# Patient Record
Sex: Male | Born: 1950 | Race: White | Hispanic: No | Marital: Married | Smoking: Never smoker
Health system: Southern US, Community
[De-identification: ages and names within clinical notes are randomized; demographics above are authoritative.]

## PROBLEM LIST (undated history)

## (undated) DIAGNOSIS — G8929 Other chronic pain: Secondary | ICD-10-CM

## (undated) DIAGNOSIS — K219 Gastro-esophageal reflux disease without esophagitis: Secondary | ICD-10-CM

## (undated) DIAGNOSIS — E669 Obesity, unspecified: Secondary | ICD-10-CM

## (undated) DIAGNOSIS — R519 Headache, unspecified: Secondary | ICD-10-CM

## (undated) DIAGNOSIS — R001 Bradycardia, unspecified: Secondary | ICD-10-CM

## (undated) DIAGNOSIS — G629 Polyneuropathy, unspecified: Secondary | ICD-10-CM

## (undated) DIAGNOSIS — E559 Vitamin D deficiency, unspecified: Secondary | ICD-10-CM

## (undated) DIAGNOSIS — G3184 Mild cognitive impairment, so stated: Secondary | ICD-10-CM

## (undated) DIAGNOSIS — E7800 Pure hypercholesterolemia, unspecified: Secondary | ICD-10-CM

## (undated) DIAGNOSIS — K589 Irritable bowel syndrome without diarrhea: Secondary | ICD-10-CM

## (undated) HISTORY — PX: SPINE SURGERY: SHX786

## (undated) HISTORY — DX: Other chronic pain: G89.29

## (undated) HISTORY — DX: Gastro-esophageal reflux disease without esophagitis: K21.9

## (undated) HISTORY — DX: Obesity, unspecified: E66.9

## (undated) HISTORY — DX: Polyneuropathy, unspecified: G62.9

## (undated) HISTORY — DX: Headache, unspecified: R51.9

## (undated) HISTORY — DX: Irritable bowel syndrome, unspecified: K58.9

## (undated) HISTORY — DX: Bradycardia, unspecified: R00.1

## (undated) HISTORY — DX: Pure hypercholesterolemia, unspecified: E78.00

## (undated) HISTORY — DX: Mild cognitive impairment of uncertain or unknown etiology: G31.84

## (undated) HISTORY — DX: Vitamin D deficiency, unspecified: E55.9

---

## 2015-01-29 DIAGNOSIS — L570 Actinic keratosis: Secondary | ICD-10-CM | POA: Diagnosis not present

## 2015-01-29 DIAGNOSIS — L821 Other seborrheic keratosis: Secondary | ICD-10-CM | POA: Diagnosis not present

## 2015-04-19 DIAGNOSIS — H16143 Punctate keratitis, bilateral: Secondary | ICD-10-CM | POA: Diagnosis not present

## 2015-06-11 DIAGNOSIS — D510 Vitamin B12 deficiency anemia due to intrinsic factor deficiency: Secondary | ICD-10-CM | POA: Diagnosis not present

## 2015-06-11 DIAGNOSIS — N419 Inflammatory disease of prostate, unspecified: Secondary | ICD-10-CM | POA: Diagnosis not present

## 2015-06-11 DIAGNOSIS — Z13220 Encounter for screening for lipoid disorders: Secondary | ICD-10-CM | POA: Diagnosis not present

## 2015-06-11 DIAGNOSIS — Z125 Encounter for screening for malignant neoplasm of prostate: Secondary | ICD-10-CM | POA: Diagnosis not present

## 2015-06-11 DIAGNOSIS — Z79899 Other long term (current) drug therapy: Secondary | ICD-10-CM | POA: Diagnosis not present

## 2015-06-11 DIAGNOSIS — R5383 Other fatigue: Secondary | ICD-10-CM | POA: Diagnosis not present

## 2015-06-11 DIAGNOSIS — Z2821 Immunization not carried out because of patient refusal: Secondary | ICD-10-CM | POA: Diagnosis not present

## 2015-06-11 DIAGNOSIS — Z0000 Encounter for general adult medical examination without abnormal findings: Secondary | ICD-10-CM | POA: Diagnosis not present

## 2015-06-11 DIAGNOSIS — E785 Hyperlipidemia, unspecified: Secondary | ICD-10-CM | POA: Diagnosis not present

## 2015-09-03 DIAGNOSIS — Z9181 History of falling: Secondary | ICD-10-CM | POA: Diagnosis not present

## 2015-09-03 DIAGNOSIS — Z0000 Encounter for general adult medical examination without abnormal findings: Secondary | ICD-10-CM | POA: Diagnosis not present

## 2015-09-03 DIAGNOSIS — Z23 Encounter for immunization: Secondary | ICD-10-CM | POA: Diagnosis not present

## 2015-09-03 DIAGNOSIS — Z1389 Encounter for screening for other disorder: Secondary | ICD-10-CM | POA: Diagnosis not present

## 2016-02-06 DIAGNOSIS — J209 Acute bronchitis, unspecified: Secondary | ICD-10-CM | POA: Diagnosis not present

## 2016-02-22 DIAGNOSIS — R05 Cough: Secondary | ICD-10-CM | POA: Diagnosis not present

## 2016-02-27 DIAGNOSIS — J189 Pneumonia, unspecified organism: Secondary | ICD-10-CM | POA: Diagnosis not present

## 2016-03-06 DIAGNOSIS — H6123 Impacted cerumen, bilateral: Secondary | ICD-10-CM | POA: Diagnosis not present

## 2016-03-07 DIAGNOSIS — H66012 Acute suppurative otitis media with spontaneous rupture of ear drum, left ear: Secondary | ICD-10-CM | POA: Diagnosis not present

## 2016-03-07 DIAGNOSIS — R42 Dizziness and giddiness: Secondary | ICD-10-CM | POA: Diagnosis not present

## 2016-03-07 DIAGNOSIS — R109 Unspecified abdominal pain: Secondary | ICD-10-CM | POA: Diagnosis not present

## 2016-03-07 DIAGNOSIS — H60322 Hemorrhagic otitis externa, left ear: Secondary | ICD-10-CM | POA: Diagnosis not present

## 2016-03-07 DIAGNOSIS — H7292 Unspecified perforation of tympanic membrane, left ear: Secondary | ICD-10-CM | POA: Diagnosis not present

## 2016-03-07 DIAGNOSIS — H8302 Labyrinthitis, left ear: Secondary | ICD-10-CM | POA: Diagnosis not present

## 2016-03-07 DIAGNOSIS — H6502 Acute serous otitis media, left ear: Secondary | ICD-10-CM | POA: Diagnosis not present

## 2016-03-07 DIAGNOSIS — R112 Nausea with vomiting, unspecified: Secondary | ICD-10-CM | POA: Diagnosis not present

## 2016-03-08 DIAGNOSIS — H8302 Labyrinthitis, left ear: Secondary | ICD-10-CM | POA: Diagnosis not present

## 2016-03-08 DIAGNOSIS — R112 Nausea with vomiting, unspecified: Secondary | ICD-10-CM | POA: Diagnosis not present

## 2016-03-08 DIAGNOSIS — H60322 Hemorrhagic otitis externa, left ear: Secondary | ICD-10-CM | POA: Diagnosis not present

## 2016-03-08 DIAGNOSIS — H6502 Acute serous otitis media, left ear: Secondary | ICD-10-CM | POA: Diagnosis not present

## 2016-03-08 DIAGNOSIS — H66012 Acute suppurative otitis media with spontaneous rupture of ear drum, left ear: Secondary | ICD-10-CM | POA: Diagnosis not present

## 2016-03-08 DIAGNOSIS — R42 Dizziness and giddiness: Secondary | ICD-10-CM | POA: Diagnosis not present

## 2016-03-08 DIAGNOSIS — R109 Unspecified abdominal pain: Secondary | ICD-10-CM | POA: Diagnosis not present

## 2016-03-08 DIAGNOSIS — H7292 Unspecified perforation of tympanic membrane, left ear: Secondary | ICD-10-CM | POA: Diagnosis not present

## 2016-03-12 DIAGNOSIS — H60312 Diffuse otitis externa, left ear: Secondary | ICD-10-CM | POA: Diagnosis not present

## 2016-03-12 DIAGNOSIS — H9202 Otalgia, left ear: Secondary | ICD-10-CM | POA: Diagnosis not present

## 2016-03-12 DIAGNOSIS — H66012 Acute suppurative otitis media with spontaneous rupture of ear drum, left ear: Secondary | ICD-10-CM | POA: Diagnosis not present

## 2016-03-12 DIAGNOSIS — J342 Deviated nasal septum: Secondary | ICD-10-CM | POA: Diagnosis not present

## 2016-03-12 DIAGNOSIS — H7292 Unspecified perforation of tympanic membrane, left ear: Secondary | ICD-10-CM | POA: Diagnosis not present

## 2016-03-12 DIAGNOSIS — H9212 Otorrhea, left ear: Secondary | ICD-10-CM | POA: Diagnosis not present

## 2016-03-20 DIAGNOSIS — H73002 Acute myringitis, left ear: Secondary | ICD-10-CM | POA: Diagnosis not present

## 2016-03-20 DIAGNOSIS — H60312 Diffuse otitis externa, left ear: Secondary | ICD-10-CM | POA: Diagnosis not present

## 2016-03-20 DIAGNOSIS — H9202 Otalgia, left ear: Secondary | ICD-10-CM | POA: Diagnosis not present

## 2016-03-26 DIAGNOSIS — J189 Pneumonia, unspecified organism: Secondary | ICD-10-CM | POA: Diagnosis not present

## 2016-03-26 DIAGNOSIS — Z23 Encounter for immunization: Secondary | ICD-10-CM | POA: Diagnosis not present

## 2016-03-26 DIAGNOSIS — H6092 Unspecified otitis externa, left ear: Secondary | ICD-10-CM | POA: Diagnosis not present

## 2016-04-10 DIAGNOSIS — J40 Bronchitis, not specified as acute or chronic: Secondary | ICD-10-CM | POA: Diagnosis not present

## 2016-04-20 DIAGNOSIS — J209 Acute bronchitis, unspecified: Secondary | ICD-10-CM | POA: Diagnosis not present

## 2016-04-30 DIAGNOSIS — K591 Functional diarrhea: Secondary | ICD-10-CM | POA: Diagnosis not present

## 2016-04-30 DIAGNOSIS — R109 Unspecified abdominal pain: Secondary | ICD-10-CM | POA: Diagnosis not present

## 2016-04-30 DIAGNOSIS — K219 Gastro-esophageal reflux disease without esophagitis: Secondary | ICD-10-CM | POA: Diagnosis not present

## 2016-07-14 DIAGNOSIS — N419 Inflammatory disease of prostate, unspecified: Secondary | ICD-10-CM | POA: Diagnosis not present

## 2016-11-19 DIAGNOSIS — H6123 Impacted cerumen, bilateral: Secondary | ICD-10-CM | POA: Diagnosis not present

## 2017-02-02 DIAGNOSIS — Z23 Encounter for immunization: Secondary | ICD-10-CM | POA: Diagnosis not present

## 2017-02-23 DIAGNOSIS — Z6825 Body mass index (BMI) 25.0-25.9, adult: Secondary | ICD-10-CM | POA: Diagnosis not present

## 2017-02-23 DIAGNOSIS — D519 Vitamin B12 deficiency anemia, unspecified: Secondary | ICD-10-CM | POA: Diagnosis not present

## 2017-02-23 DIAGNOSIS — E785 Hyperlipidemia, unspecified: Secondary | ICD-10-CM | POA: Diagnosis not present

## 2017-02-23 DIAGNOSIS — Z125 Encounter for screening for malignant neoplasm of prostate: Secondary | ICD-10-CM | POA: Diagnosis not present

## 2017-02-23 DIAGNOSIS — Z9181 History of falling: Secondary | ICD-10-CM | POA: Diagnosis not present

## 2017-02-23 DIAGNOSIS — Z79899 Other long term (current) drug therapy: Secondary | ICD-10-CM | POA: Diagnosis not present

## 2017-02-23 DIAGNOSIS — Z1331 Encounter for screening for depression: Secondary | ICD-10-CM | POA: Diagnosis not present

## 2017-02-23 DIAGNOSIS — Z0000 Encounter for general adult medical examination without abnormal findings: Secondary | ICD-10-CM | POA: Diagnosis not present

## 2017-03-07 DIAGNOSIS — J209 Acute bronchitis, unspecified: Secondary | ICD-10-CM | POA: Diagnosis not present

## 2017-03-24 DIAGNOSIS — Z6825 Body mass index (BMI) 25.0-25.9, adult: Secondary | ICD-10-CM | POA: Diagnosis not present

## 2017-03-24 DIAGNOSIS — J40 Bronchitis, not specified as acute or chronic: Secondary | ICD-10-CM | POA: Diagnosis not present

## 2017-03-24 DIAGNOSIS — J329 Chronic sinusitis, unspecified: Secondary | ICD-10-CM | POA: Diagnosis not present

## 2017-06-04 DIAGNOSIS — K521 Toxic gastroenteritis and colitis: Secondary | ICD-10-CM | POA: Diagnosis not present

## 2017-06-21 DIAGNOSIS — A932 Colorado tick fever: Secondary | ICD-10-CM | POA: Diagnosis not present

## 2017-06-21 DIAGNOSIS — L299 Pruritus, unspecified: Secondary | ICD-10-CM | POA: Diagnosis not present

## 2017-09-20 DIAGNOSIS — M545 Low back pain: Secondary | ICD-10-CM | POA: Diagnosis not present

## 2017-09-20 DIAGNOSIS — M9902 Segmental and somatic dysfunction of thoracic region: Secondary | ICD-10-CM | POA: Diagnosis not present

## 2017-09-20 DIAGNOSIS — M9905 Segmental and somatic dysfunction of pelvic region: Secondary | ICD-10-CM | POA: Diagnosis not present

## 2017-09-20 DIAGNOSIS — M9903 Segmental and somatic dysfunction of lumbar region: Secondary | ICD-10-CM | POA: Diagnosis not present

## 2017-09-22 DIAGNOSIS — M9902 Segmental and somatic dysfunction of thoracic region: Secondary | ICD-10-CM | POA: Diagnosis not present

## 2017-09-22 DIAGNOSIS — M9903 Segmental and somatic dysfunction of lumbar region: Secondary | ICD-10-CM | POA: Diagnosis not present

## 2017-09-22 DIAGNOSIS — M545 Low back pain: Secondary | ICD-10-CM | POA: Diagnosis not present

## 2017-09-22 DIAGNOSIS — M9905 Segmental and somatic dysfunction of pelvic region: Secondary | ICD-10-CM | POA: Diagnosis not present

## 2017-11-30 DIAGNOSIS — M5431 Sciatica, right side: Secondary | ICD-10-CM | POA: Diagnosis not present

## 2017-11-30 DIAGNOSIS — Z23 Encounter for immunization: Secondary | ICD-10-CM | POA: Diagnosis not present

## 2017-11-30 DIAGNOSIS — Z1339 Encounter for screening examination for other mental health and behavioral disorders: Secondary | ICD-10-CM | POA: Diagnosis not present

## 2017-11-30 DIAGNOSIS — Z6825 Body mass index (BMI) 25.0-25.9, adult: Secondary | ICD-10-CM | POA: Diagnosis not present

## 2018-01-20 DIAGNOSIS — M545 Low back pain: Secondary | ICD-10-CM | POA: Diagnosis not present

## 2018-01-20 DIAGNOSIS — M4306 Spondylolysis, lumbar region: Secondary | ICD-10-CM | POA: Diagnosis not present

## 2018-02-07 DIAGNOSIS — M4187 Other forms of scoliosis, lumbosacral region: Secondary | ICD-10-CM | POA: Diagnosis not present

## 2018-02-07 DIAGNOSIS — M545 Low back pain: Secondary | ICD-10-CM | POA: Diagnosis not present

## 2018-02-21 DIAGNOSIS — M545 Low back pain: Secondary | ICD-10-CM | POA: Diagnosis not present

## 2018-02-21 DIAGNOSIS — M4306 Spondylolysis, lumbar region: Secondary | ICD-10-CM | POA: Diagnosis not present

## 2018-02-21 DIAGNOSIS — M48062 Spinal stenosis, lumbar region with neurogenic claudication: Secondary | ICD-10-CM | POA: Diagnosis not present

## 2018-02-24 DIAGNOSIS — M5442 Lumbago with sciatica, left side: Secondary | ICD-10-CM | POA: Diagnosis not present

## 2018-02-24 DIAGNOSIS — M545 Low back pain: Secondary | ICD-10-CM | POA: Diagnosis not present

## 2018-02-24 DIAGNOSIS — M48062 Spinal stenosis, lumbar region with neurogenic claudication: Secondary | ICD-10-CM | POA: Diagnosis not present

## 2018-03-01 DIAGNOSIS — M48062 Spinal stenosis, lumbar region with neurogenic claudication: Secondary | ICD-10-CM | POA: Diagnosis not present

## 2018-03-10 DIAGNOSIS — R69 Illness, unspecified: Secondary | ICD-10-CM | POA: Diagnosis not present

## 2018-03-21 DIAGNOSIS — Z6826 Body mass index (BMI) 26.0-26.9, adult: Secondary | ICD-10-CM | POA: Diagnosis not present

## 2018-03-21 DIAGNOSIS — M545 Low back pain: Secondary | ICD-10-CM | POA: Diagnosis not present

## 2018-03-21 DIAGNOSIS — M5442 Lumbago with sciatica, left side: Secondary | ICD-10-CM | POA: Diagnosis not present

## 2018-03-21 DIAGNOSIS — M48062 Spinal stenosis, lumbar region with neurogenic claudication: Secondary | ICD-10-CM | POA: Diagnosis not present

## 2018-03-21 DIAGNOSIS — M4306 Spondylolysis, lumbar region: Secondary | ICD-10-CM | POA: Diagnosis not present

## 2018-04-21 DIAGNOSIS — M5416 Radiculopathy, lumbar region: Secondary | ICD-10-CM | POA: Diagnosis not present

## 2018-04-21 DIAGNOSIS — M4306 Spondylolysis, lumbar region: Secondary | ICD-10-CM | POA: Diagnosis not present

## 2018-04-21 DIAGNOSIS — J0100 Acute maxillary sinusitis, unspecified: Secondary | ICD-10-CM | POA: Diagnosis not present

## 2018-04-21 DIAGNOSIS — M5136 Other intervertebral disc degeneration, lumbar region: Secondary | ICD-10-CM | POA: Diagnosis not present

## 2018-04-21 DIAGNOSIS — Z6826 Body mass index (BMI) 26.0-26.9, adult: Secondary | ICD-10-CM | POA: Diagnosis not present

## 2018-04-26 DIAGNOSIS — Z13220 Encounter for screening for lipoid disorders: Secondary | ICD-10-CM | POA: Diagnosis not present

## 2018-04-26 DIAGNOSIS — Z0000 Encounter for general adult medical examination without abnormal findings: Secondary | ICD-10-CM | POA: Diagnosis not present

## 2018-04-26 DIAGNOSIS — Z6826 Body mass index (BMI) 26.0-26.9, adult: Secondary | ICD-10-CM | POA: Diagnosis not present

## 2018-04-26 DIAGNOSIS — Z79899 Other long term (current) drug therapy: Secondary | ICD-10-CM | POA: Diagnosis not present

## 2018-04-26 DIAGNOSIS — R5383 Other fatigue: Secondary | ICD-10-CM | POA: Diagnosis not present

## 2018-04-26 DIAGNOSIS — J329 Chronic sinusitis, unspecified: Secondary | ICD-10-CM | POA: Diagnosis not present

## 2018-04-26 DIAGNOSIS — J40 Bronchitis, not specified as acute or chronic: Secondary | ICD-10-CM | POA: Diagnosis not present

## 2018-05-15 DIAGNOSIS — M792 Neuralgia and neuritis, unspecified: Secondary | ICD-10-CM | POA: Diagnosis not present

## 2018-05-23 DIAGNOSIS — M4308 Spondylolysis, sacral and sacrococcygeal region: Secondary | ICD-10-CM | POA: Diagnosis not present

## 2018-05-23 DIAGNOSIS — M545 Low back pain: Secondary | ICD-10-CM | POA: Diagnosis not present

## 2018-05-23 DIAGNOSIS — Z4689 Encounter for fitting and adjustment of other specified devices: Secondary | ICD-10-CM | POA: Diagnosis not present

## 2018-05-23 DIAGNOSIS — M5416 Radiculopathy, lumbar region: Secondary | ICD-10-CM | POA: Diagnosis not present

## 2018-05-23 DIAGNOSIS — M5136 Other intervertebral disc degeneration, lumbar region: Secondary | ICD-10-CM | POA: Diagnosis not present

## 2018-06-03 DIAGNOSIS — R001 Bradycardia, unspecified: Secondary | ICD-10-CM | POA: Diagnosis not present

## 2018-06-03 DIAGNOSIS — R9431 Abnormal electrocardiogram [ECG] [EKG]: Secondary | ICD-10-CM | POA: Diagnosis not present

## 2018-06-03 DIAGNOSIS — Z01818 Encounter for other preprocedural examination: Secondary | ICD-10-CM | POA: Diagnosis not present

## 2018-06-13 DIAGNOSIS — M5117 Intervertebral disc disorders with radiculopathy, lumbosacral region: Secondary | ICD-10-CM | POA: Diagnosis not present

## 2018-06-13 DIAGNOSIS — M5416 Radiculopathy, lumbar region: Secondary | ICD-10-CM | POA: Diagnosis not present

## 2018-06-13 DIAGNOSIS — M4807 Spinal stenosis, lumbosacral region: Secondary | ICD-10-CM | POA: Diagnosis not present

## 2018-06-13 DIAGNOSIS — R110 Nausea: Secondary | ICD-10-CM | POA: Diagnosis not present

## 2018-06-13 DIAGNOSIS — M5106 Intervertebral disc disorders with myelopathy, lumbar region: Secondary | ICD-10-CM | POA: Diagnosis not present

## 2018-06-13 DIAGNOSIS — M47896 Other spondylosis, lumbar region: Secondary | ICD-10-CM | POA: Diagnosis not present

## 2018-06-13 DIAGNOSIS — M5137 Other intervertebral disc degeneration, lumbosacral region: Secondary | ICD-10-CM | POA: Diagnosis not present

## 2018-06-13 DIAGNOSIS — M48061 Spinal stenosis, lumbar region without neurogenic claudication: Secondary | ICD-10-CM | POA: Diagnosis not present

## 2018-06-13 DIAGNOSIS — M48062 Spinal stenosis, lumbar region with neurogenic claudication: Secondary | ICD-10-CM | POA: Diagnosis not present

## 2018-06-13 DIAGNOSIS — M51369 Other intervertebral disc degeneration, lumbar region without mention of lumbar back pain or lower extremity pain: Secondary | ICD-10-CM

## 2018-06-13 DIAGNOSIS — M545 Low back pain: Secondary | ICD-10-CM | POA: Diagnosis not present

## 2018-06-13 DIAGNOSIS — M4326 Fusion of spine, lumbar region: Secondary | ICD-10-CM | POA: Diagnosis not present

## 2018-06-13 DIAGNOSIS — R200 Anesthesia of skin: Secondary | ICD-10-CM | POA: Diagnosis not present

## 2018-06-13 DIAGNOSIS — M4716 Other spondylosis with myelopathy, lumbar region: Secondary | ICD-10-CM | POA: Diagnosis not present

## 2018-06-13 DIAGNOSIS — M5116 Intervertebral disc disorders with radiculopathy, lumbar region: Secondary | ICD-10-CM | POA: Diagnosis not present

## 2018-06-13 DIAGNOSIS — M4726 Other spondylosis with radiculopathy, lumbar region: Secondary | ICD-10-CM | POA: Diagnosis not present

## 2018-06-13 DIAGNOSIS — M5136 Other intervertebral disc degeneration, lumbar region: Secondary | ICD-10-CM | POA: Insufficient documentation

## 2018-06-13 HISTORY — DX: Other intervertebral disc degeneration, lumbar region without mention of lumbar back pain or lower extremity pain: M51.369

## 2018-06-28 ENCOUNTER — Ambulatory Visit
Admission: RE | Admit: 2018-06-28 | Discharge: 2018-06-28 | Disposition: A | Discharge status: Home / Self Care | Payer: Medicare HMO | Source: Ambulatory Visit | Attending: Rehabilitation | Admitting: Rehabilitation

## 2018-06-28 ENCOUNTER — Other Ambulatory Visit: Payer: Self-pay

## 2018-06-28 ENCOUNTER — Other Ambulatory Visit: Payer: Self-pay | Admitting: Rehabilitation

## 2018-06-28 DIAGNOSIS — I824Y2 Acute embolism and thrombosis of unspecified deep veins of left proximal lower extremity: Secondary | ICD-10-CM

## 2018-06-28 DIAGNOSIS — M549 Dorsalgia, unspecified: Secondary | ICD-10-CM | POA: Diagnosis not present

## 2018-06-28 DIAGNOSIS — R600 Localized edema: Secondary | ICD-10-CM | POA: Diagnosis not present

## 2018-07-11 DIAGNOSIS — M545 Low back pain: Secondary | ICD-10-CM | POA: Diagnosis not present

## 2018-07-15 DIAGNOSIS — M25572 Pain in left ankle and joints of left foot: Secondary | ICD-10-CM | POA: Diagnosis not present

## 2018-07-15 DIAGNOSIS — Z6826 Body mass index (BMI) 26.0-26.9, adult: Secondary | ICD-10-CM | POA: Diagnosis not present

## 2018-08-16 DIAGNOSIS — Z0000 Encounter for general adult medical examination without abnormal findings: Secondary | ICD-10-CM | POA: Diagnosis not present

## 2018-08-16 DIAGNOSIS — Z6826 Body mass index (BMI) 26.0-26.9, adult: Secondary | ICD-10-CM | POA: Diagnosis not present

## 2018-08-16 DIAGNOSIS — Z1331 Encounter for screening for depression: Secondary | ICD-10-CM | POA: Diagnosis not present

## 2018-08-16 DIAGNOSIS — Z9181 History of falling: Secondary | ICD-10-CM | POA: Diagnosis not present

## 2018-09-08 DIAGNOSIS — Z79899 Other long term (current) drug therapy: Secondary | ICD-10-CM | POA: Diagnosis not present

## 2018-09-08 DIAGNOSIS — G894 Chronic pain syndrome: Secondary | ICD-10-CM | POA: Diagnosis not present

## 2018-09-08 DIAGNOSIS — M4716 Other spondylosis with myelopathy, lumbar region: Secondary | ICD-10-CM | POA: Diagnosis not present

## 2018-09-08 DIAGNOSIS — Z79891 Long term (current) use of opiate analgesic: Secondary | ICD-10-CM | POA: Diagnosis not present

## 2018-12-08 DIAGNOSIS — M4327 Fusion of spine, lumbosacral region: Secondary | ICD-10-CM | POA: Diagnosis not present

## 2018-12-08 DIAGNOSIS — M545 Low back pain: Secondary | ICD-10-CM | POA: Diagnosis not present

## 2018-12-30 DIAGNOSIS — Z23 Encounter for immunization: Secondary | ICD-10-CM | POA: Diagnosis not present

## 2019-03-31 DIAGNOSIS — Z0100 Encounter for examination of eyes and vision without abnormal findings: Secondary | ICD-10-CM | POA: Diagnosis not present

## 2019-03-31 DIAGNOSIS — Z961 Presence of intraocular lens: Secondary | ICD-10-CM | POA: Diagnosis not present

## 2019-06-08 DIAGNOSIS — M4327 Fusion of spine, lumbosacral region: Secondary | ICD-10-CM | POA: Diagnosis not present

## 2019-06-08 DIAGNOSIS — M545 Low back pain: Secondary | ICD-10-CM | POA: Diagnosis not present

## 2019-06-08 DIAGNOSIS — M5416 Radiculopathy, lumbar region: Secondary | ICD-10-CM | POA: Diagnosis not present

## 2019-06-13 DIAGNOSIS — M5430 Sciatica, unspecified side: Secondary | ICD-10-CM | POA: Diagnosis not present

## 2019-06-13 DIAGNOSIS — G8929 Other chronic pain: Secondary | ICD-10-CM | POA: Diagnosis not present

## 2019-06-13 DIAGNOSIS — M549 Dorsalgia, unspecified: Secondary | ICD-10-CM | POA: Diagnosis not present

## 2019-06-13 DIAGNOSIS — M79602 Pain in left arm: Secondary | ICD-10-CM | POA: Diagnosis not present

## 2019-06-13 DIAGNOSIS — Z6826 Body mass index (BMI) 26.0-26.9, adult: Secondary | ICD-10-CM | POA: Diagnosis not present

## 2019-06-14 DIAGNOSIS — M5416 Radiculopathy, lumbar region: Secondary | ICD-10-CM | POA: Diagnosis not present

## 2019-06-14 DIAGNOSIS — M545 Low back pain: Secondary | ICD-10-CM | POA: Diagnosis not present

## 2019-06-14 DIAGNOSIS — Z6826 Body mass index (BMI) 26.0-26.9, adult: Secondary | ICD-10-CM | POA: Diagnosis not present

## 2019-06-14 DIAGNOSIS — M4327 Fusion of spine, lumbosacral region: Secondary | ICD-10-CM | POA: Diagnosis not present

## 2019-06-27 DIAGNOSIS — M48061 Spinal stenosis, lumbar region without neurogenic claudication: Secondary | ICD-10-CM | POA: Diagnosis not present

## 2019-06-27 DIAGNOSIS — Z981 Arthrodesis status: Secondary | ICD-10-CM | POA: Diagnosis not present

## 2019-07-17 DIAGNOSIS — M5416 Radiculopathy, lumbar region: Secondary | ICD-10-CM | POA: Diagnosis not present

## 2019-07-17 DIAGNOSIS — M4327 Fusion of spine, lumbosacral region: Secondary | ICD-10-CM | POA: Diagnosis not present

## 2019-07-17 DIAGNOSIS — Z6826 Body mass index (BMI) 26.0-26.9, adult: Secondary | ICD-10-CM | POA: Diagnosis not present

## 2019-07-17 DIAGNOSIS — M545 Low back pain: Secondary | ICD-10-CM | POA: Diagnosis not present

## 2019-07-25 DIAGNOSIS — M47816 Spondylosis without myelopathy or radiculopathy, lumbar region: Secondary | ICD-10-CM | POA: Diagnosis not present

## 2019-09-19 DIAGNOSIS — J218 Acute bronchiolitis due to other specified organisms: Secondary | ICD-10-CM | POA: Diagnosis not present

## 2019-09-19 DIAGNOSIS — Z20828 Contact with and (suspected) exposure to other viral communicable diseases: Secondary | ICD-10-CM | POA: Diagnosis not present

## 2019-10-10 DIAGNOSIS — M7542 Impingement syndrome of left shoulder: Secondary | ICD-10-CM | POA: Diagnosis not present

## 2019-11-11 DIAGNOSIS — Z20828 Contact with and (suspected) exposure to other viral communicable diseases: Secondary | ICD-10-CM | POA: Diagnosis not present

## 2019-11-11 DIAGNOSIS — J3489 Other specified disorders of nose and nasal sinuses: Secondary | ICD-10-CM | POA: Diagnosis not present

## 2019-11-21 DIAGNOSIS — M7540 Impingement syndrome of unspecified shoulder: Secondary | ICD-10-CM | POA: Diagnosis not present

## 2019-12-31 DIAGNOSIS — R809 Proteinuria, unspecified: Secondary | ICD-10-CM | POA: Diagnosis not present

## 2019-12-31 DIAGNOSIS — N401 Enlarged prostate with lower urinary tract symptoms: Secondary | ICD-10-CM | POA: Diagnosis not present

## 2020-01-24 DIAGNOSIS — L820 Inflamed seborrheic keratosis: Secondary | ICD-10-CM | POA: Diagnosis not present

## 2020-01-29 DIAGNOSIS — M47816 Spondylosis without myelopathy or radiculopathy, lumbar region: Secondary | ICD-10-CM | POA: Diagnosis not present

## 2020-01-31 DIAGNOSIS — Z23 Encounter for immunization: Secondary | ICD-10-CM | POA: Diagnosis not present

## 2020-02-01 DIAGNOSIS — K5730 Diverticulosis of large intestine without perforation or abscess without bleeding: Secondary | ICD-10-CM | POA: Diagnosis not present

## 2020-02-01 DIAGNOSIS — K429 Umbilical hernia without obstruction or gangrene: Secondary | ICD-10-CM | POA: Diagnosis not present

## 2020-02-01 DIAGNOSIS — R102 Pelvic and perineal pain: Secondary | ICD-10-CM | POA: Diagnosis not present

## 2020-02-01 DIAGNOSIS — M5136 Other intervertebral disc degeneration, lumbar region: Secondary | ICD-10-CM | POA: Diagnosis not present

## 2020-02-01 DIAGNOSIS — R1032 Left lower quadrant pain: Secondary | ICD-10-CM | POA: Diagnosis not present

## 2020-02-01 IMAGING — US VENOUS DOPPLER ULTRASOUND OF LEFT LOWER EXTREMITY
1 series · 13 of 24 positions shown · non-contrast
Comparison: None.

CLINICAL DATA: Pain, edema left lower extremity



[Series 1: venous doppler ultrasound of left lower extremity · 0.08mm/px · 13 of 31 slices shown]
[im 1/31]
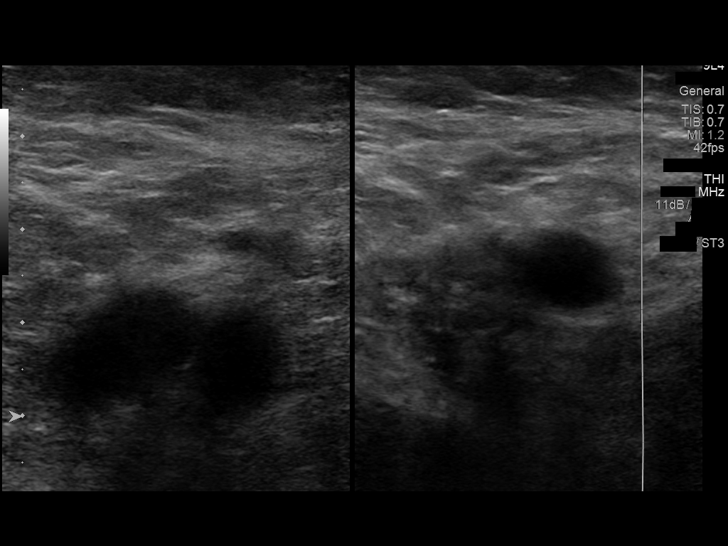
[im 3/31]
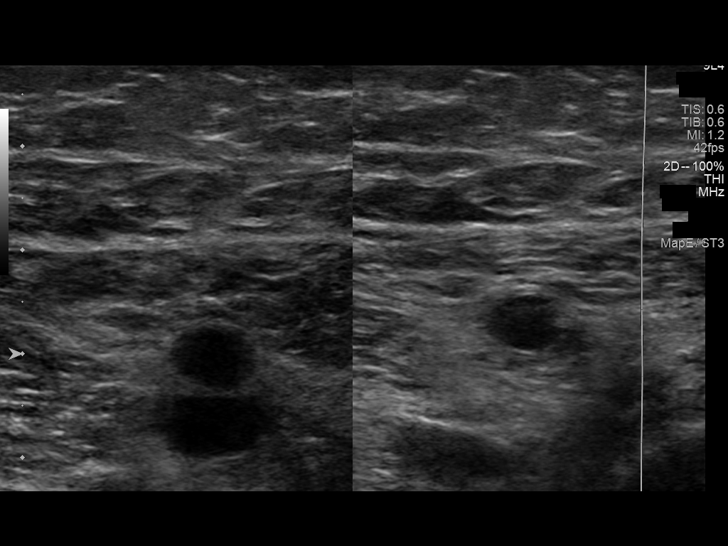
[im 6/31]
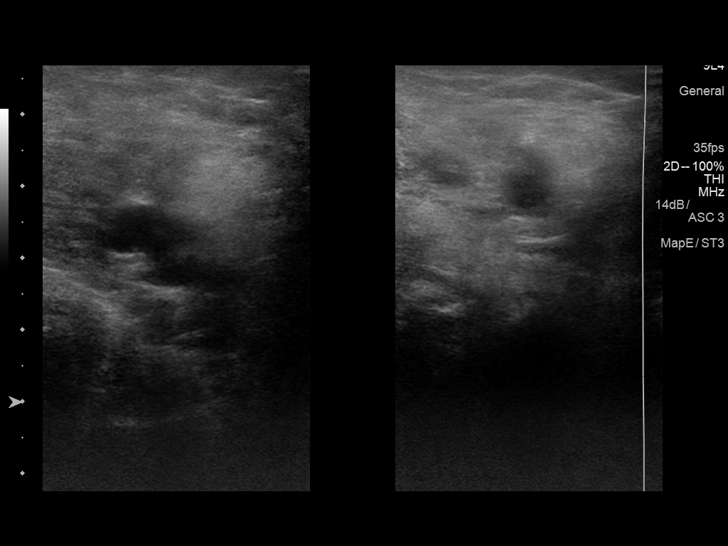
[im 8/31]
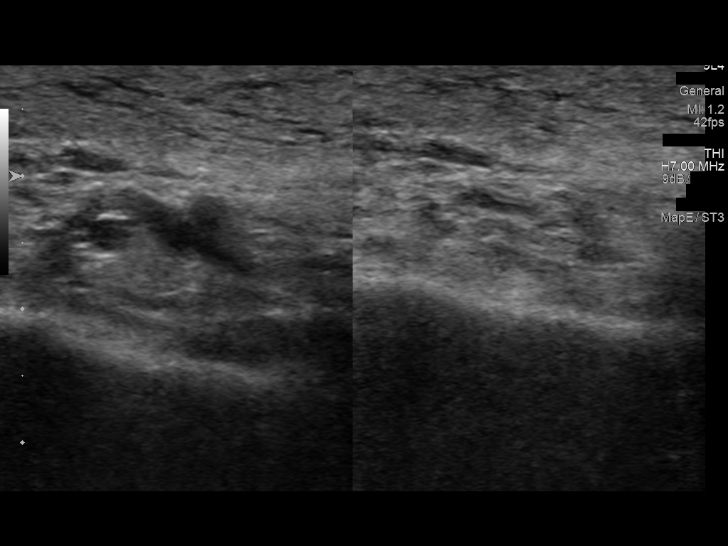
[im 11/31]
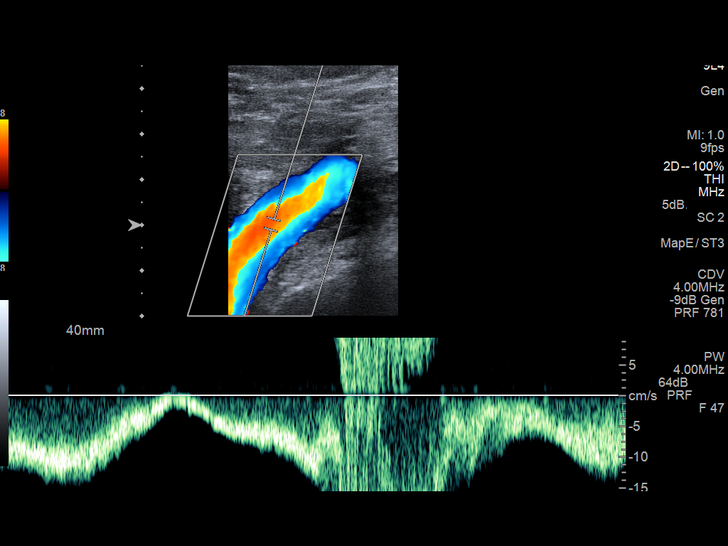
[im 14/31]
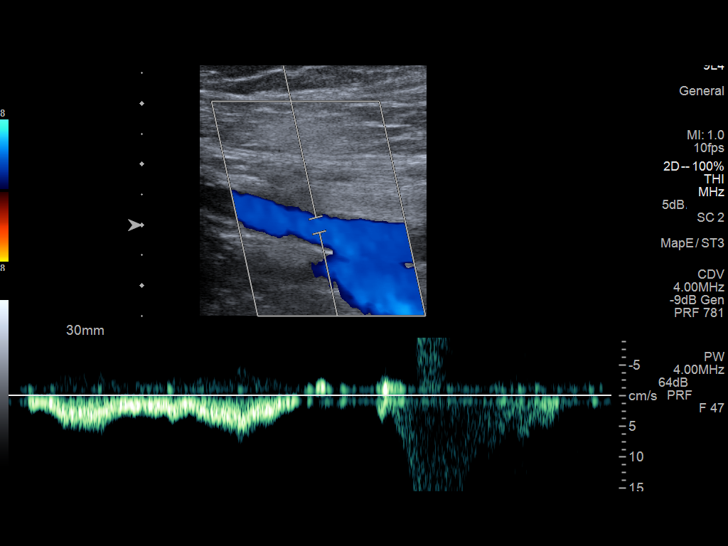
[im 16/31]
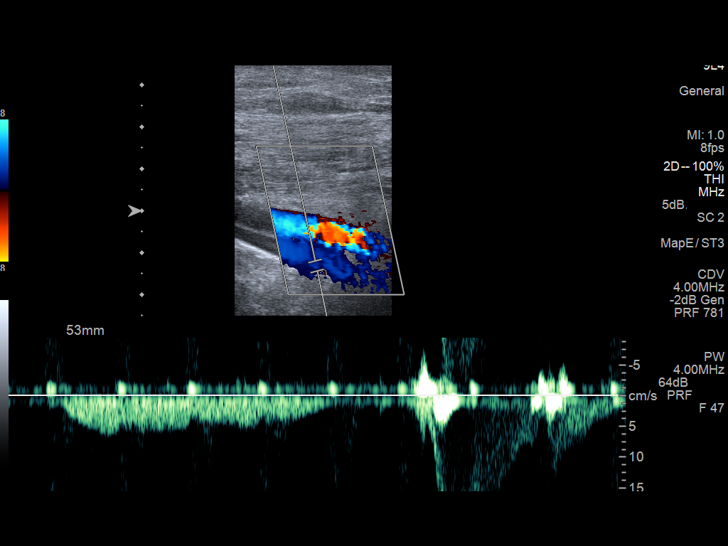
[im 17/31]
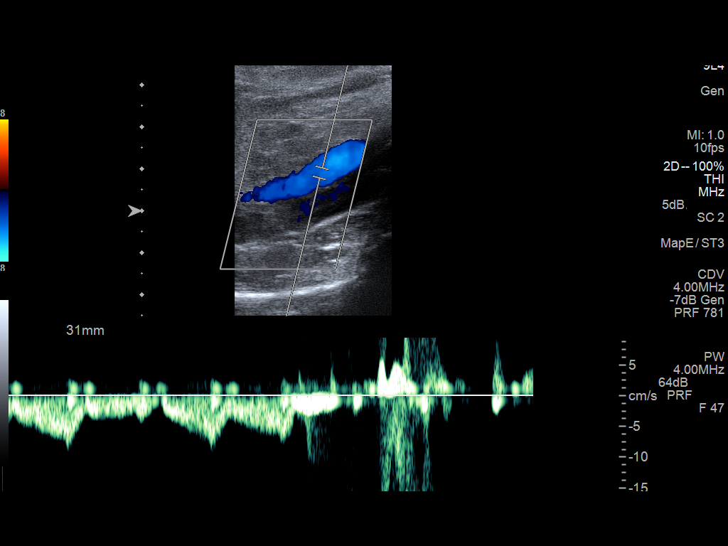
[im 20/31]
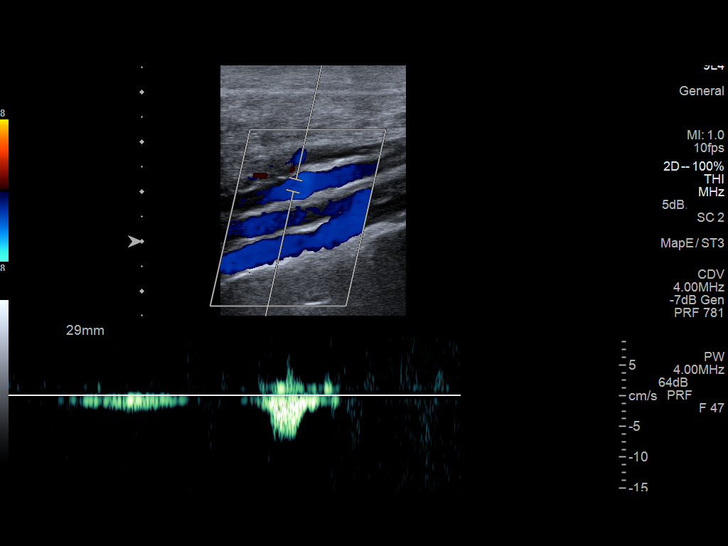
[im 23/31]
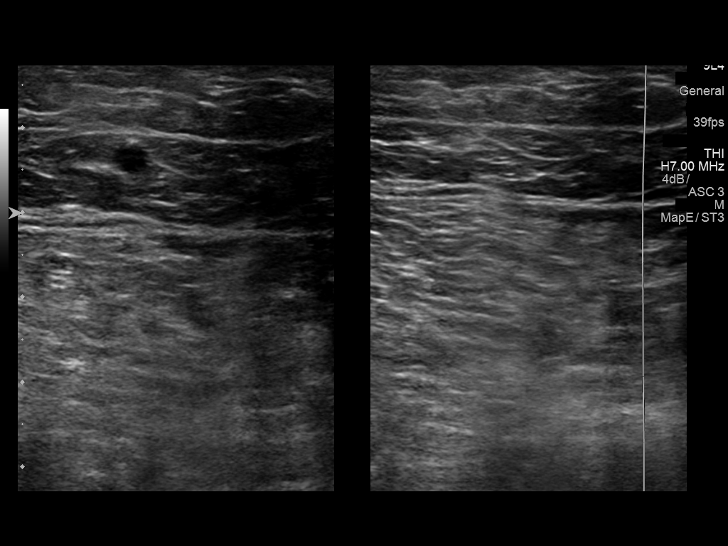
[im 25/31]
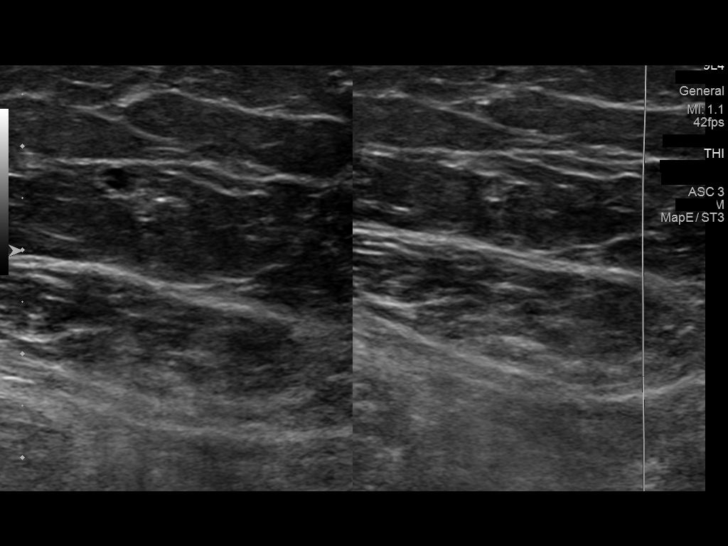
[im 28/31]
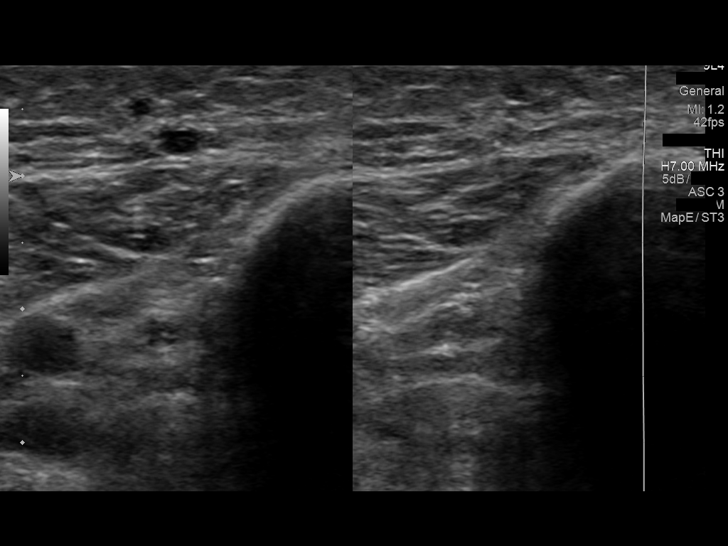
[im 31/31]
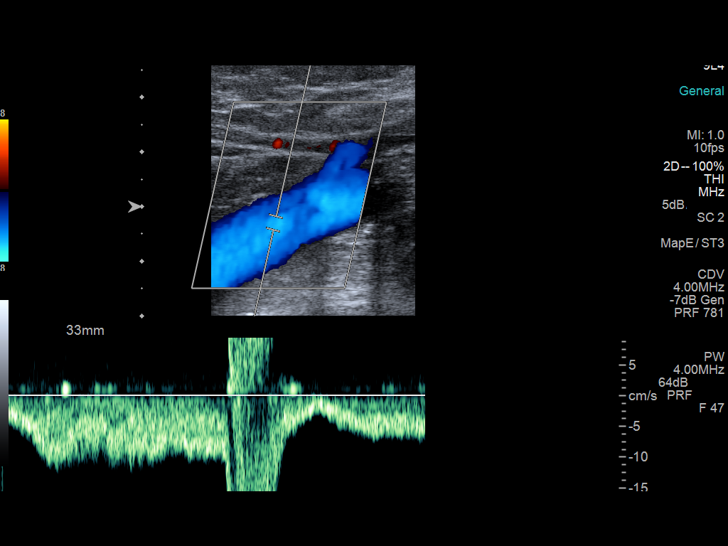

[13 of 24 positions shown; findings below may reference images not displayed]

FINDINGS: Contralateral Common Femoral Vein: Respiratory phasicity is normal
and symmetric with the symptomatic side. No evidence of thrombus.
Normal compressibility.

Common Femoral Vein: No evidence of thrombus. Normal
compressibility, respiratory phasicity and response to augmentation.

Saphenofemoral Junction: No evidence of thrombus. Normal
compressibility and flow on color Doppler imaging.

Profunda Femoral Vein: No evidence of thrombus. Normal
compressibility and flow on color Doppler imaging.

Femoral Vein: No evidence of thrombus. Normal compressibility,
respiratory phasicity and response to augmentation.

Popliteal Vein: No evidence of thrombus. Normal compressibility,
respiratory phasicity and response to augmentation.

Calf Veins: No evidence of thrombus. Normal compressibility and flow
on color Doppler imaging.

Superficial Great Saphenous Vein: No evidence of thrombus. Normal
compressibility.

Venous Reflux:  Not assessed

Other Findings:  None.
IMPRESSION: No evidence of deep venous thrombosis.

## 2020-02-05 DIAGNOSIS — K5792 Diverticulitis of intestine, part unspecified, without perforation or abscess without bleeding: Secondary | ICD-10-CM | POA: Diagnosis not present

## 2020-02-05 DIAGNOSIS — Z0000 Encounter for general adult medical examination without abnormal findings: Secondary | ICD-10-CM | POA: Diagnosis not present

## 2020-02-05 DIAGNOSIS — Z6826 Body mass index (BMI) 26.0-26.9, adult: Secondary | ICD-10-CM | POA: Diagnosis not present

## 2020-02-05 DIAGNOSIS — G8929 Other chronic pain: Secondary | ICD-10-CM | POA: Diagnosis not present

## 2020-02-05 DIAGNOSIS — M549 Dorsalgia, unspecified: Secondary | ICD-10-CM | POA: Diagnosis not present

## 2020-02-08 DIAGNOSIS — R109 Unspecified abdominal pain: Secondary | ICD-10-CM | POA: Diagnosis not present

## 2020-02-08 DIAGNOSIS — Z6826 Body mass index (BMI) 26.0-26.9, adult: Secondary | ICD-10-CM | POA: Diagnosis not present

## 2020-02-08 DIAGNOSIS — K589 Irritable bowel syndrome without diarrhea: Secondary | ICD-10-CM | POA: Diagnosis not present

## 2020-02-08 DIAGNOSIS — K529 Noninfective gastroenteritis and colitis, unspecified: Secondary | ICD-10-CM | POA: Diagnosis not present

## 2020-03-11 DIAGNOSIS — K591 Functional diarrhea: Secondary | ICD-10-CM | POA: Diagnosis not present

## 2020-03-11 DIAGNOSIS — K219 Gastro-esophageal reflux disease without esophagitis: Secondary | ICD-10-CM | POA: Diagnosis not present

## 2020-03-11 DIAGNOSIS — R109 Unspecified abdominal pain: Secondary | ICD-10-CM | POA: Diagnosis not present

## 2020-03-12 DIAGNOSIS — M47816 Spondylosis without myelopathy or radiculopathy, lumbar region: Secondary | ICD-10-CM | POA: Diagnosis not present

## 2020-04-01 DIAGNOSIS — Z20828 Contact with and (suspected) exposure to other viral communicable diseases: Secondary | ICD-10-CM | POA: Diagnosis not present

## 2020-04-01 DIAGNOSIS — Z1159 Encounter for screening for other viral diseases: Secondary | ICD-10-CM | POA: Diagnosis not present

## 2020-04-05 DIAGNOSIS — Z6825 Body mass index (BMI) 25.0-25.9, adult: Secondary | ICD-10-CM | POA: Diagnosis not present

## 2020-04-05 DIAGNOSIS — Z0000 Encounter for general adult medical examination without abnormal findings: Secondary | ICD-10-CM | POA: Diagnosis not present

## 2020-04-05 DIAGNOSIS — Z9181 History of falling: Secondary | ICD-10-CM | POA: Diagnosis not present

## 2020-04-05 DIAGNOSIS — Z1331 Encounter for screening for depression: Secondary | ICD-10-CM | POA: Diagnosis not present

## 2020-04-10 DIAGNOSIS — M4327 Fusion of spine, lumbosacral region: Secondary | ICD-10-CM | POA: Diagnosis not present

## 2020-04-10 DIAGNOSIS — M47816 Spondylosis without myelopathy or radiculopathy, lumbar region: Secondary | ICD-10-CM | POA: Diagnosis not present

## 2020-04-18 DIAGNOSIS — M4716 Other spondylosis with myelopathy, lumbar region: Secondary | ICD-10-CM | POA: Diagnosis not present

## 2020-04-18 DIAGNOSIS — M5106 Intervertebral disc disorders with myelopathy, lumbar region: Secondary | ICD-10-CM | POA: Diagnosis not present

## 2020-04-18 DIAGNOSIS — M4327 Fusion of spine, lumbosacral region: Secondary | ICD-10-CM | POA: Diagnosis not present

## 2020-04-18 DIAGNOSIS — M48062 Spinal stenosis, lumbar region with neurogenic claudication: Secondary | ICD-10-CM | POA: Diagnosis not present

## 2020-04-19 DIAGNOSIS — Z1159 Encounter for screening for other viral diseases: Secondary | ICD-10-CM | POA: Diagnosis not present

## 2020-04-19 DIAGNOSIS — Z20828 Contact with and (suspected) exposure to other viral communicable diseases: Secondary | ICD-10-CM | POA: Diagnosis not present

## 2020-04-26 DIAGNOSIS — R1032 Left lower quadrant pain: Secondary | ICD-10-CM | POA: Diagnosis not present

## 2020-04-26 DIAGNOSIS — M1990 Unspecified osteoarthritis, unspecified site: Secondary | ICD-10-CM | POA: Diagnosis not present

## 2020-04-26 DIAGNOSIS — K5730 Diverticulosis of large intestine without perforation or abscess without bleeding: Secondary | ICD-10-CM | POA: Diagnosis not present

## 2020-04-26 DIAGNOSIS — R1031 Right lower quadrant pain: Secondary | ICD-10-CM | POA: Diagnosis not present

## 2020-04-26 DIAGNOSIS — Z79899 Other long term (current) drug therapy: Secondary | ICD-10-CM | POA: Diagnosis not present

## 2020-05-24 DIAGNOSIS — M4327 Fusion of spine, lumbosacral region: Secondary | ICD-10-CM | POA: Diagnosis not present

## 2020-05-24 DIAGNOSIS — M48062 Spinal stenosis, lumbar region with neurogenic claudication: Secondary | ICD-10-CM | POA: Diagnosis not present

## 2020-05-24 DIAGNOSIS — M5106 Intervertebral disc disorders with myelopathy, lumbar region: Secondary | ICD-10-CM | POA: Diagnosis not present

## 2020-05-24 DIAGNOSIS — M4716 Other spondylosis with myelopathy, lumbar region: Secondary | ICD-10-CM | POA: Diagnosis not present

## 2020-05-31 DIAGNOSIS — M48062 Spinal stenosis, lumbar region with neurogenic claudication: Secondary | ICD-10-CM | POA: Diagnosis not present

## 2020-05-31 DIAGNOSIS — Z01812 Encounter for preprocedural laboratory examination: Secondary | ICD-10-CM | POA: Diagnosis not present

## 2020-05-31 DIAGNOSIS — R001 Bradycardia, unspecified: Secondary | ICD-10-CM | POA: Diagnosis not present

## 2020-05-31 DIAGNOSIS — Z01810 Encounter for preprocedural cardiovascular examination: Secondary | ICD-10-CM | POA: Diagnosis not present

## 2020-06-10 DIAGNOSIS — Z981 Arthrodesis status: Secondary | ICD-10-CM | POA: Diagnosis not present

## 2020-06-10 DIAGNOSIS — Z79899 Other long term (current) drug therapy: Secondary | ICD-10-CM | POA: Diagnosis not present

## 2020-06-10 DIAGNOSIS — M4316 Spondylolisthesis, lumbar region: Secondary | ICD-10-CM | POA: Diagnosis not present

## 2020-06-10 DIAGNOSIS — M4326 Fusion of spine, lumbar region: Secondary | ICD-10-CM | POA: Diagnosis not present

## 2020-06-10 DIAGNOSIS — Z881 Allergy status to other antibiotic agents status: Secondary | ICD-10-CM | POA: Diagnosis not present

## 2020-06-10 DIAGNOSIS — M545 Low back pain, unspecified: Secondary | ICD-10-CM | POA: Diagnosis not present

## 2020-06-10 DIAGNOSIS — M4726 Other spondylosis with radiculopathy, lumbar region: Secondary | ICD-10-CM | POA: Diagnosis not present

## 2020-06-10 DIAGNOSIS — M4716 Other spondylosis with myelopathy, lumbar region: Secondary | ICD-10-CM | POA: Diagnosis not present

## 2020-06-10 DIAGNOSIS — M532X6 Spinal instabilities, lumbar region: Secondary | ICD-10-CM | POA: Diagnosis not present

## 2020-06-10 DIAGNOSIS — M47816 Spondylosis without myelopathy or radiculopathy, lumbar region: Secondary | ICD-10-CM | POA: Diagnosis not present

## 2020-06-10 DIAGNOSIS — Z9049 Acquired absence of other specified parts of digestive tract: Secondary | ICD-10-CM | POA: Diagnosis not present

## 2020-06-10 DIAGNOSIS — M48062 Spinal stenosis, lumbar region with neurogenic claudication: Secondary | ICD-10-CM | POA: Diagnosis not present

## 2020-06-10 DIAGNOSIS — K219 Gastro-esophageal reflux disease without esophagitis: Secondary | ICD-10-CM | POA: Diagnosis not present

## 2020-06-10 DIAGNOSIS — M5136 Other intervertebral disc degeneration, lumbar region: Secondary | ICD-10-CM | POA: Diagnosis not present

## 2020-06-11 DIAGNOSIS — Z981 Arthrodesis status: Secondary | ICD-10-CM | POA: Diagnosis not present

## 2020-06-11 DIAGNOSIS — M47816 Spondylosis without myelopathy or radiculopathy, lumbar region: Secondary | ICD-10-CM | POA: Diagnosis not present

## 2020-06-11 DIAGNOSIS — M4326 Fusion of spine, lumbar region: Secondary | ICD-10-CM | POA: Diagnosis not present

## 2020-06-15 DIAGNOSIS — M4726 Other spondylosis with radiculopathy, lumbar region: Secondary | ICD-10-CM | POA: Diagnosis not present

## 2020-06-15 DIAGNOSIS — Z4789 Encounter for other orthopedic aftercare: Secondary | ICD-10-CM | POA: Diagnosis not present

## 2020-06-15 DIAGNOSIS — M48062 Spinal stenosis, lumbar region with neurogenic claudication: Secondary | ICD-10-CM | POA: Diagnosis not present

## 2020-06-15 DIAGNOSIS — M5106 Intervertebral disc disorders with myelopathy, lumbar region: Secondary | ICD-10-CM | POA: Diagnosis not present

## 2020-06-15 DIAGNOSIS — M5116 Intervertebral disc disorders with radiculopathy, lumbar region: Secondary | ICD-10-CM | POA: Diagnosis not present

## 2020-06-15 DIAGNOSIS — Z981 Arthrodesis status: Secondary | ICD-10-CM | POA: Diagnosis not present

## 2020-06-15 DIAGNOSIS — M4716 Other spondylosis with myelopathy, lumbar region: Secondary | ICD-10-CM | POA: Diagnosis not present

## 2020-06-15 DIAGNOSIS — M4316 Spondylolisthesis, lumbar region: Secondary | ICD-10-CM | POA: Diagnosis not present

## 2020-06-19 DIAGNOSIS — Z4789 Encounter for other orthopedic aftercare: Secondary | ICD-10-CM | POA: Diagnosis not present

## 2020-06-19 DIAGNOSIS — M5106 Intervertebral disc disorders with myelopathy, lumbar region: Secondary | ICD-10-CM | POA: Diagnosis not present

## 2020-06-19 DIAGNOSIS — M4726 Other spondylosis with radiculopathy, lumbar region: Secondary | ICD-10-CM | POA: Diagnosis not present

## 2020-06-19 DIAGNOSIS — M48062 Spinal stenosis, lumbar region with neurogenic claudication: Secondary | ICD-10-CM | POA: Diagnosis not present

## 2020-06-19 DIAGNOSIS — M5116 Intervertebral disc disorders with radiculopathy, lumbar region: Secondary | ICD-10-CM | POA: Diagnosis not present

## 2020-06-19 DIAGNOSIS — Z981 Arthrodesis status: Secondary | ICD-10-CM | POA: Diagnosis not present

## 2020-06-19 DIAGNOSIS — M4716 Other spondylosis with myelopathy, lumbar region: Secondary | ICD-10-CM | POA: Diagnosis not present

## 2020-06-19 DIAGNOSIS — M4316 Spondylolisthesis, lumbar region: Secondary | ICD-10-CM | POA: Diagnosis not present

## 2020-06-21 DIAGNOSIS — M48062 Spinal stenosis, lumbar region with neurogenic claudication: Secondary | ICD-10-CM | POA: Diagnosis not present

## 2020-06-21 DIAGNOSIS — Z4789 Encounter for other orthopedic aftercare: Secondary | ICD-10-CM | POA: Diagnosis not present

## 2020-06-21 DIAGNOSIS — M5106 Intervertebral disc disorders with myelopathy, lumbar region: Secondary | ICD-10-CM | POA: Diagnosis not present

## 2020-06-21 DIAGNOSIS — M4726 Other spondylosis with radiculopathy, lumbar region: Secondary | ICD-10-CM | POA: Diagnosis not present

## 2020-06-21 DIAGNOSIS — M4716 Other spondylosis with myelopathy, lumbar region: Secondary | ICD-10-CM | POA: Diagnosis not present

## 2020-06-21 DIAGNOSIS — M5116 Intervertebral disc disorders with radiculopathy, lumbar region: Secondary | ICD-10-CM | POA: Diagnosis not present

## 2020-06-21 DIAGNOSIS — M4316 Spondylolisthesis, lumbar region: Secondary | ICD-10-CM | POA: Diagnosis not present

## 2020-06-21 DIAGNOSIS — Z981 Arthrodesis status: Secondary | ICD-10-CM | POA: Diagnosis not present

## 2020-06-26 DIAGNOSIS — M5106 Intervertebral disc disorders with myelopathy, lumbar region: Secondary | ICD-10-CM | POA: Diagnosis not present

## 2020-06-26 DIAGNOSIS — Z4789 Encounter for other orthopedic aftercare: Secondary | ICD-10-CM | POA: Diagnosis not present

## 2020-06-26 DIAGNOSIS — M4726 Other spondylosis with radiculopathy, lumbar region: Secondary | ICD-10-CM | POA: Diagnosis not present

## 2020-06-26 DIAGNOSIS — M4716 Other spondylosis with myelopathy, lumbar region: Secondary | ICD-10-CM | POA: Diagnosis not present

## 2020-06-26 DIAGNOSIS — M4316 Spondylolisthesis, lumbar region: Secondary | ICD-10-CM | POA: Diagnosis not present

## 2020-06-26 DIAGNOSIS — Z981 Arthrodesis status: Secondary | ICD-10-CM | POA: Diagnosis not present

## 2020-06-26 DIAGNOSIS — M5116 Intervertebral disc disorders with radiculopathy, lumbar region: Secondary | ICD-10-CM | POA: Diagnosis not present

## 2020-06-26 DIAGNOSIS — M48062 Spinal stenosis, lumbar region with neurogenic claudication: Secondary | ICD-10-CM | POA: Diagnosis not present

## 2020-06-27 DIAGNOSIS — M5116 Intervertebral disc disorders with radiculopathy, lumbar region: Secondary | ICD-10-CM | POA: Diagnosis not present

## 2020-06-27 DIAGNOSIS — Z4789 Encounter for other orthopedic aftercare: Secondary | ICD-10-CM | POA: Diagnosis not present

## 2020-06-27 DIAGNOSIS — M4716 Other spondylosis with myelopathy, lumbar region: Secondary | ICD-10-CM | POA: Diagnosis not present

## 2020-06-27 DIAGNOSIS — M4726 Other spondylosis with radiculopathy, lumbar region: Secondary | ICD-10-CM | POA: Diagnosis not present

## 2020-06-27 DIAGNOSIS — M4316 Spondylolisthesis, lumbar region: Secondary | ICD-10-CM | POA: Diagnosis not present

## 2020-06-27 DIAGNOSIS — M5106 Intervertebral disc disorders with myelopathy, lumbar region: Secondary | ICD-10-CM | POA: Diagnosis not present

## 2020-06-27 DIAGNOSIS — Z981 Arthrodesis status: Secondary | ICD-10-CM | POA: Diagnosis not present

## 2020-06-27 DIAGNOSIS — M48062 Spinal stenosis, lumbar region with neurogenic claudication: Secondary | ICD-10-CM | POA: Diagnosis not present

## 2020-06-28 DIAGNOSIS — M4726 Other spondylosis with radiculopathy, lumbar region: Secondary | ICD-10-CM | POA: Diagnosis not present

## 2020-06-28 DIAGNOSIS — M5106 Intervertebral disc disorders with myelopathy, lumbar region: Secondary | ICD-10-CM | POA: Diagnosis not present

## 2020-06-28 DIAGNOSIS — M48062 Spinal stenosis, lumbar region with neurogenic claudication: Secondary | ICD-10-CM | POA: Diagnosis not present

## 2020-06-28 DIAGNOSIS — M4716 Other spondylosis with myelopathy, lumbar region: Secondary | ICD-10-CM | POA: Diagnosis not present

## 2020-06-28 DIAGNOSIS — Z4789 Encounter for other orthopedic aftercare: Secondary | ICD-10-CM | POA: Diagnosis not present

## 2020-06-28 DIAGNOSIS — M4316 Spondylolisthesis, lumbar region: Secondary | ICD-10-CM | POA: Diagnosis not present

## 2020-06-28 DIAGNOSIS — Z981 Arthrodesis status: Secondary | ICD-10-CM | POA: Diagnosis not present

## 2020-06-28 DIAGNOSIS — M5116 Intervertebral disc disorders with radiculopathy, lumbar region: Secondary | ICD-10-CM | POA: Diagnosis not present

## 2020-07-01 DIAGNOSIS — M4716 Other spondylosis with myelopathy, lumbar region: Secondary | ICD-10-CM | POA: Diagnosis not present

## 2020-07-01 DIAGNOSIS — M4726 Other spondylosis with radiculopathy, lumbar region: Secondary | ICD-10-CM | POA: Diagnosis not present

## 2020-07-01 DIAGNOSIS — M5106 Intervertebral disc disorders with myelopathy, lumbar region: Secondary | ICD-10-CM | POA: Diagnosis not present

## 2020-07-01 DIAGNOSIS — M48062 Spinal stenosis, lumbar region with neurogenic claudication: Secondary | ICD-10-CM | POA: Diagnosis not present

## 2020-07-01 DIAGNOSIS — M4316 Spondylolisthesis, lumbar region: Secondary | ICD-10-CM | POA: Diagnosis not present

## 2020-07-01 DIAGNOSIS — Z981 Arthrodesis status: Secondary | ICD-10-CM | POA: Diagnosis not present

## 2020-07-01 DIAGNOSIS — M5116 Intervertebral disc disorders with radiculopathy, lumbar region: Secondary | ICD-10-CM | POA: Diagnosis not present

## 2020-07-01 DIAGNOSIS — Z4789 Encounter for other orthopedic aftercare: Secondary | ICD-10-CM | POA: Diagnosis not present

## 2020-07-04 DIAGNOSIS — M48062 Spinal stenosis, lumbar region with neurogenic claudication: Secondary | ICD-10-CM | POA: Diagnosis not present

## 2020-07-04 DIAGNOSIS — M5116 Intervertebral disc disorders with radiculopathy, lumbar region: Secondary | ICD-10-CM | POA: Diagnosis not present

## 2020-07-04 DIAGNOSIS — M4726 Other spondylosis with radiculopathy, lumbar region: Secondary | ICD-10-CM | POA: Diagnosis not present

## 2020-07-04 DIAGNOSIS — M4316 Spondylolisthesis, lumbar region: Secondary | ICD-10-CM | POA: Diagnosis not present

## 2020-07-04 DIAGNOSIS — Z4789 Encounter for other orthopedic aftercare: Secondary | ICD-10-CM | POA: Diagnosis not present

## 2020-07-04 DIAGNOSIS — M4716 Other spondylosis with myelopathy, lumbar region: Secondary | ICD-10-CM | POA: Diagnosis not present

## 2020-07-04 DIAGNOSIS — M5106 Intervertebral disc disorders with myelopathy, lumbar region: Secondary | ICD-10-CM | POA: Diagnosis not present

## 2020-07-04 DIAGNOSIS — Z981 Arthrodesis status: Secondary | ICD-10-CM | POA: Diagnosis not present

## 2020-07-08 DIAGNOSIS — M48062 Spinal stenosis, lumbar region with neurogenic claudication: Secondary | ICD-10-CM | POA: Diagnosis not present

## 2020-07-08 DIAGNOSIS — Z4789 Encounter for other orthopedic aftercare: Secondary | ICD-10-CM | POA: Diagnosis not present

## 2020-07-08 DIAGNOSIS — M5116 Intervertebral disc disorders with radiculopathy, lumbar region: Secondary | ICD-10-CM | POA: Diagnosis not present

## 2020-07-08 DIAGNOSIS — M4716 Other spondylosis with myelopathy, lumbar region: Secondary | ICD-10-CM | POA: Diagnosis not present

## 2020-07-08 DIAGNOSIS — Z981 Arthrodesis status: Secondary | ICD-10-CM | POA: Diagnosis not present

## 2020-07-08 DIAGNOSIS — M4726 Other spondylosis with radiculopathy, lumbar region: Secondary | ICD-10-CM | POA: Diagnosis not present

## 2020-07-08 DIAGNOSIS — M4316 Spondylolisthesis, lumbar region: Secondary | ICD-10-CM | POA: Diagnosis not present

## 2020-07-08 DIAGNOSIS — M5106 Intervertebral disc disorders with myelopathy, lumbar region: Secondary | ICD-10-CM | POA: Diagnosis not present

## 2020-07-10 DIAGNOSIS — Z4789 Encounter for other orthopedic aftercare: Secondary | ICD-10-CM | POA: Diagnosis not present

## 2020-07-10 DIAGNOSIS — M4726 Other spondylosis with radiculopathy, lumbar region: Secondary | ICD-10-CM | POA: Diagnosis not present

## 2020-07-10 DIAGNOSIS — M48062 Spinal stenosis, lumbar region with neurogenic claudication: Secondary | ICD-10-CM | POA: Diagnosis not present

## 2020-07-10 DIAGNOSIS — Z981 Arthrodesis status: Secondary | ICD-10-CM | POA: Diagnosis not present

## 2020-07-10 DIAGNOSIS — M4716 Other spondylosis with myelopathy, lumbar region: Secondary | ICD-10-CM | POA: Diagnosis not present

## 2020-07-10 DIAGNOSIS — M5116 Intervertebral disc disorders with radiculopathy, lumbar region: Secondary | ICD-10-CM | POA: Diagnosis not present

## 2020-07-10 DIAGNOSIS — M5106 Intervertebral disc disorders with myelopathy, lumbar region: Secondary | ICD-10-CM | POA: Diagnosis not present

## 2020-07-10 DIAGNOSIS — M4316 Spondylolisthesis, lumbar region: Secondary | ICD-10-CM | POA: Diagnosis not present

## 2020-07-15 DIAGNOSIS — Z4789 Encounter for other orthopedic aftercare: Secondary | ICD-10-CM | POA: Diagnosis not present

## 2020-07-15 DIAGNOSIS — M4716 Other spondylosis with myelopathy, lumbar region: Secondary | ICD-10-CM | POA: Diagnosis not present

## 2020-07-15 DIAGNOSIS — M4726 Other spondylosis with radiculopathy, lumbar region: Secondary | ICD-10-CM | POA: Diagnosis not present

## 2020-07-15 DIAGNOSIS — M5116 Intervertebral disc disorders with radiculopathy, lumbar region: Secondary | ICD-10-CM | POA: Diagnosis not present

## 2020-07-15 DIAGNOSIS — Z981 Arthrodesis status: Secondary | ICD-10-CM | POA: Diagnosis not present

## 2020-07-15 DIAGNOSIS — M48062 Spinal stenosis, lumbar region with neurogenic claudication: Secondary | ICD-10-CM | POA: Diagnosis not present

## 2020-07-15 DIAGNOSIS — M5106 Intervertebral disc disorders with myelopathy, lumbar region: Secondary | ICD-10-CM | POA: Diagnosis not present

## 2020-07-15 DIAGNOSIS — M4316 Spondylolisthesis, lumbar region: Secondary | ICD-10-CM | POA: Diagnosis not present

## 2020-07-18 DIAGNOSIS — M48062 Spinal stenosis, lumbar region with neurogenic claudication: Secondary | ICD-10-CM | POA: Diagnosis not present

## 2020-07-18 DIAGNOSIS — M4726 Other spondylosis with radiculopathy, lumbar region: Secondary | ICD-10-CM | POA: Diagnosis not present

## 2020-07-18 DIAGNOSIS — M5106 Intervertebral disc disorders with myelopathy, lumbar region: Secondary | ICD-10-CM | POA: Diagnosis not present

## 2020-07-18 DIAGNOSIS — Z981 Arthrodesis status: Secondary | ICD-10-CM | POA: Diagnosis not present

## 2020-07-18 DIAGNOSIS — M4716 Other spondylosis with myelopathy, lumbar region: Secondary | ICD-10-CM | POA: Diagnosis not present

## 2020-07-18 DIAGNOSIS — M4316 Spondylolisthesis, lumbar region: Secondary | ICD-10-CM | POA: Diagnosis not present

## 2020-07-18 DIAGNOSIS — M5116 Intervertebral disc disorders with radiculopathy, lumbar region: Secondary | ICD-10-CM | POA: Diagnosis not present

## 2020-07-18 DIAGNOSIS — Z4789 Encounter for other orthopedic aftercare: Secondary | ICD-10-CM | POA: Diagnosis not present

## 2020-07-19 DIAGNOSIS — M532X6 Spinal instabilities, lumbar region: Secondary | ICD-10-CM | POA: Diagnosis not present

## 2020-07-19 DIAGNOSIS — M5116 Intervertebral disc disorders with radiculopathy, lumbar region: Secondary | ICD-10-CM | POA: Diagnosis not present

## 2020-07-19 DIAGNOSIS — Z981 Arthrodesis status: Secondary | ICD-10-CM | POA: Diagnosis not present

## 2020-07-19 DIAGNOSIS — M4316 Spondylolisthesis, lumbar region: Secondary | ICD-10-CM | POA: Diagnosis not present

## 2020-07-22 DIAGNOSIS — M4726 Other spondylosis with radiculopathy, lumbar region: Secondary | ICD-10-CM | POA: Diagnosis not present

## 2020-07-22 DIAGNOSIS — Z981 Arthrodesis status: Secondary | ICD-10-CM | POA: Diagnosis not present

## 2020-07-22 DIAGNOSIS — M5116 Intervertebral disc disorders with radiculopathy, lumbar region: Secondary | ICD-10-CM | POA: Diagnosis not present

## 2020-07-22 DIAGNOSIS — Z4789 Encounter for other orthopedic aftercare: Secondary | ICD-10-CM | POA: Diagnosis not present

## 2020-07-22 DIAGNOSIS — M5106 Intervertebral disc disorders with myelopathy, lumbar region: Secondary | ICD-10-CM | POA: Diagnosis not present

## 2020-07-22 DIAGNOSIS — M4316 Spondylolisthesis, lumbar region: Secondary | ICD-10-CM | POA: Diagnosis not present

## 2020-07-22 DIAGNOSIS — M4716 Other spondylosis with myelopathy, lumbar region: Secondary | ICD-10-CM | POA: Diagnosis not present

## 2020-07-22 DIAGNOSIS — M48062 Spinal stenosis, lumbar region with neurogenic claudication: Secondary | ICD-10-CM | POA: Diagnosis not present

## 2020-07-24 DIAGNOSIS — M4726 Other spondylosis with radiculopathy, lumbar region: Secondary | ICD-10-CM | POA: Diagnosis not present

## 2020-07-24 DIAGNOSIS — M4716 Other spondylosis with myelopathy, lumbar region: Secondary | ICD-10-CM | POA: Diagnosis not present

## 2020-07-24 DIAGNOSIS — M5106 Intervertebral disc disorders with myelopathy, lumbar region: Secondary | ICD-10-CM | POA: Diagnosis not present

## 2020-07-24 DIAGNOSIS — Z981 Arthrodesis status: Secondary | ICD-10-CM | POA: Diagnosis not present

## 2020-07-24 DIAGNOSIS — M48062 Spinal stenosis, lumbar region with neurogenic claudication: Secondary | ICD-10-CM | POA: Diagnosis not present

## 2020-07-24 DIAGNOSIS — Z4789 Encounter for other orthopedic aftercare: Secondary | ICD-10-CM | POA: Diagnosis not present

## 2020-07-24 DIAGNOSIS — M4316 Spondylolisthesis, lumbar region: Secondary | ICD-10-CM | POA: Diagnosis not present

## 2020-07-24 DIAGNOSIS — M5116 Intervertebral disc disorders with radiculopathy, lumbar region: Secondary | ICD-10-CM | POA: Diagnosis not present

## 2020-08-14 DIAGNOSIS — M6281 Muscle weakness (generalized): Secondary | ICD-10-CM | POA: Diagnosis not present

## 2020-08-14 DIAGNOSIS — M79605 Pain in left leg: Secondary | ICD-10-CM | POA: Diagnosis not present

## 2020-08-14 DIAGNOSIS — M5459 Other low back pain: Secondary | ICD-10-CM | POA: Diagnosis not present

## 2020-08-14 DIAGNOSIS — M79604 Pain in right leg: Secondary | ICD-10-CM | POA: Diagnosis not present

## 2020-08-20 DIAGNOSIS — M79604 Pain in right leg: Secondary | ICD-10-CM | POA: Diagnosis not present

## 2020-08-20 DIAGNOSIS — M5459 Other low back pain: Secondary | ICD-10-CM | POA: Diagnosis not present

## 2020-08-20 DIAGNOSIS — M79605 Pain in left leg: Secondary | ICD-10-CM | POA: Diagnosis not present

## 2020-08-20 DIAGNOSIS — M6281 Muscle weakness (generalized): Secondary | ICD-10-CM | POA: Diagnosis not present

## 2020-08-22 DIAGNOSIS — M79604 Pain in right leg: Secondary | ICD-10-CM | POA: Diagnosis not present

## 2020-08-22 DIAGNOSIS — M5459 Other low back pain: Secondary | ICD-10-CM | POA: Diagnosis not present

## 2020-08-22 DIAGNOSIS — M6281 Muscle weakness (generalized): Secondary | ICD-10-CM | POA: Diagnosis not present

## 2020-08-22 DIAGNOSIS — M79605 Pain in left leg: Secondary | ICD-10-CM | POA: Diagnosis not present

## 2020-08-27 DIAGNOSIS — M79605 Pain in left leg: Secondary | ICD-10-CM | POA: Diagnosis not present

## 2020-08-27 DIAGNOSIS — M6281 Muscle weakness (generalized): Secondary | ICD-10-CM | POA: Diagnosis not present

## 2020-08-27 DIAGNOSIS — M5459 Other low back pain: Secondary | ICD-10-CM | POA: Diagnosis not present

## 2020-08-27 DIAGNOSIS — M79604 Pain in right leg: Secondary | ICD-10-CM | POA: Diagnosis not present

## 2020-08-29 DIAGNOSIS — M79605 Pain in left leg: Secondary | ICD-10-CM | POA: Diagnosis not present

## 2020-08-29 DIAGNOSIS — M5459 Other low back pain: Secondary | ICD-10-CM | POA: Diagnosis not present

## 2020-08-29 DIAGNOSIS — M6281 Muscle weakness (generalized): Secondary | ICD-10-CM | POA: Diagnosis not present

## 2020-08-29 DIAGNOSIS — M79604 Pain in right leg: Secondary | ICD-10-CM | POA: Diagnosis not present

## 2020-09-03 DIAGNOSIS — M79605 Pain in left leg: Secondary | ICD-10-CM | POA: Diagnosis not present

## 2020-09-03 DIAGNOSIS — M6281 Muscle weakness (generalized): Secondary | ICD-10-CM | POA: Diagnosis not present

## 2020-09-03 DIAGNOSIS — M5459 Other low back pain: Secondary | ICD-10-CM | POA: Diagnosis not present

## 2020-09-03 DIAGNOSIS — M79604 Pain in right leg: Secondary | ICD-10-CM | POA: Diagnosis not present

## 2020-09-05 DIAGNOSIS — M79604 Pain in right leg: Secondary | ICD-10-CM | POA: Diagnosis not present

## 2020-09-05 DIAGNOSIS — M5459 Other low back pain: Secondary | ICD-10-CM | POA: Diagnosis not present

## 2020-09-05 DIAGNOSIS — M6281 Muscle weakness (generalized): Secondary | ICD-10-CM | POA: Diagnosis not present

## 2020-09-05 DIAGNOSIS — M79605 Pain in left leg: Secondary | ICD-10-CM | POA: Diagnosis not present

## 2020-09-10 DIAGNOSIS — M79604 Pain in right leg: Secondary | ICD-10-CM | POA: Diagnosis not present

## 2020-09-10 DIAGNOSIS — M79605 Pain in left leg: Secondary | ICD-10-CM | POA: Diagnosis not present

## 2020-09-10 DIAGNOSIS — M5459 Other low back pain: Secondary | ICD-10-CM | POA: Diagnosis not present

## 2020-09-10 DIAGNOSIS — M6281 Muscle weakness (generalized): Secondary | ICD-10-CM | POA: Diagnosis not present

## 2020-09-11 DIAGNOSIS — L820 Inflamed seborrheic keratosis: Secondary | ICD-10-CM | POA: Diagnosis not present

## 2020-09-11 DIAGNOSIS — L300 Nummular dermatitis: Secondary | ICD-10-CM | POA: Diagnosis not present

## 2020-09-12 DIAGNOSIS — M5459 Other low back pain: Secondary | ICD-10-CM | POA: Diagnosis not present

## 2020-09-12 DIAGNOSIS — M79605 Pain in left leg: Secondary | ICD-10-CM | POA: Diagnosis not present

## 2020-09-12 DIAGNOSIS — M6281 Muscle weakness (generalized): Secondary | ICD-10-CM | POA: Diagnosis not present

## 2020-09-12 DIAGNOSIS — M79604 Pain in right leg: Secondary | ICD-10-CM | POA: Diagnosis not present

## 2020-09-17 DIAGNOSIS — M79605 Pain in left leg: Secondary | ICD-10-CM | POA: Diagnosis not present

## 2020-09-17 DIAGNOSIS — M79604 Pain in right leg: Secondary | ICD-10-CM | POA: Diagnosis not present

## 2020-09-17 DIAGNOSIS — M6281 Muscle weakness (generalized): Secondary | ICD-10-CM | POA: Diagnosis not present

## 2020-09-17 DIAGNOSIS — M5459 Other low back pain: Secondary | ICD-10-CM | POA: Diagnosis not present

## 2020-09-19 DIAGNOSIS — M6281 Muscle weakness (generalized): Secondary | ICD-10-CM | POA: Diagnosis not present

## 2020-09-19 DIAGNOSIS — M79605 Pain in left leg: Secondary | ICD-10-CM | POA: Diagnosis not present

## 2020-09-19 DIAGNOSIS — M5459 Other low back pain: Secondary | ICD-10-CM | POA: Diagnosis not present

## 2020-09-19 DIAGNOSIS — M79604 Pain in right leg: Secondary | ICD-10-CM | POA: Diagnosis not present

## 2020-09-25 DIAGNOSIS — M4327 Fusion of spine, lumbosacral region: Secondary | ICD-10-CM | POA: Diagnosis not present

## 2020-09-25 DIAGNOSIS — M48062 Spinal stenosis, lumbar region with neurogenic claudication: Secondary | ICD-10-CM | POA: Diagnosis not present

## 2020-12-23 DIAGNOSIS — R509 Fever, unspecified: Secondary | ICD-10-CM | POA: Diagnosis not present

## 2020-12-23 DIAGNOSIS — J069 Acute upper respiratory infection, unspecified: Secondary | ICD-10-CM | POA: Diagnosis not present

## 2020-12-23 DIAGNOSIS — Z23 Encounter for immunization: Secondary | ICD-10-CM | POA: Diagnosis not present

## 2020-12-26 DIAGNOSIS — M4327 Fusion of spine, lumbosacral region: Secondary | ICD-10-CM | POA: Diagnosis not present

## 2020-12-26 DIAGNOSIS — M48062 Spinal stenosis, lumbar region with neurogenic claudication: Secondary | ICD-10-CM | POA: Diagnosis not present

## 2020-12-26 DIAGNOSIS — Z6826 Body mass index (BMI) 26.0-26.9, adult: Secondary | ICD-10-CM | POA: Diagnosis not present

## 2021-01-01 DIAGNOSIS — J918 Pleural effusion in other conditions classified elsewhere: Secondary | ICD-10-CM | POA: Diagnosis not present

## 2021-01-01 DIAGNOSIS — J209 Acute bronchitis, unspecified: Secondary | ICD-10-CM | POA: Diagnosis not present

## 2021-01-01 DIAGNOSIS — R051 Acute cough: Secondary | ICD-10-CM | POA: Diagnosis not present

## 2021-01-01 DIAGNOSIS — R0981 Nasal congestion: Secondary | ICD-10-CM | POA: Diagnosis not present

## 2021-01-06 DIAGNOSIS — G629 Polyneuropathy, unspecified: Secondary | ICD-10-CM | POA: Diagnosis not present

## 2021-01-06 DIAGNOSIS — G8929 Other chronic pain: Secondary | ICD-10-CM | POA: Diagnosis not present

## 2021-01-06 DIAGNOSIS — Z6824 Body mass index (BMI) 24.0-24.9, adult: Secondary | ICD-10-CM | POA: Diagnosis not present

## 2021-01-06 DIAGNOSIS — M549 Dorsalgia, unspecified: Secondary | ICD-10-CM | POA: Diagnosis not present

## 2021-01-15 ENCOUNTER — Ambulatory Visit: Payer: Medicare HMO | Admitting: Neurology

## 2021-01-15 ENCOUNTER — Encounter: Payer: Self-pay | Admitting: Neurology

## 2021-01-15 VITALS — BP 113/69 | HR 58 | Ht 67.0 in | Wt 158.0 lb

## 2021-01-15 DIAGNOSIS — G629 Polyneuropathy, unspecified: Secondary | ICD-10-CM

## 2021-01-15 MED ORDER — PREGABALIN 75 MG PO CAPS: 75.0000 mg | ORAL_CAPSULE | Freq: Three times a day (TID) | ORAL | 0 refills | Status: DC

## 2021-01-15 MED ORDER — TIZANIDINE HCL 4 MG PO TABS: 4.0000 mg | ORAL_TABLET | Freq: Three times a day (TID) | ORAL | 0 refills | Status: DC | PRN

## 2021-01-15 NOTE — Patient Instructions (Signed)
Start with Lyrica 75 mg nightly, then increase to 75 mg twice daily.  Can increase to 2 tab (150 mg) twice a day as tolerated  Trial of Tizanidine for the back pain  Will get neuropathy labs today  Return in 3 months

## 2021-01-15 NOTE — Progress Notes (Signed)
[facility]  PATIENT: Colin Edwards DOB: [birth year]  REFERRING CLINICIAN: Angelina Sheriff, MD HISTORY FROM: Patient and  REASON FOR VISIT: Numbness in the lower extremities   HISTORICAL  CHIEF COMPLAINT:  Chief Complaint  Patient presents with   New Patient (Initial Visit)    Rm 12, with wife, c/o burning in both legs worst in left, reports some burning in lower back     HISTORY OF PRESENT ILLNESS:  This is a 70 year old gentleman with past medical history of back pain, hyperlipidemia, GERD who is presenting with complaint of burning sensation in his leg mostly the left leg from the mid shin to the ankle.  Patient stated in March 2020 he had back surgery due to severe chronic back pain, he had plate and screws placed he mentioned that the back pain did not resolve so 2 years later in March 2022 he had another second surgery done and after the surgery he noted the burning in the legs.  He described the pain as "someone sticking a knife in his left leg".  Symptoms are getting worse.  Burning sensation is constant.  Nothing seems to improve the pain.  He is on gabapentin 300 mg 3 times daily but no improvement.  He denies any weakness of the legs and denies any falls.     OTHER MEDICAL CONDITIONS: Chronic back pain, GERD, HLD   REVIEW OF SYSTEMS: Full 14 system review of systems performed and negative with exception of: as noted in the HPI  ALLERGIES: Allergies  Allergen Reactions   Augmentin [Amoxicillin-Pot Clavulanate]    Ciprofloxacin Nausea And Vomiting    Required IV fluid   Hydrocodone    Levaquin [Levofloxacin]    Morphine And Related    Sulfa Antibiotics    Tamsulosin    Tramadol     HOME MEDICATIONS: Outpatient Medications Prior to Visit  Medication Sig Dispense Refill   ascorbic acid (VITAMIN C) 500 MG tablet Take by mouth.     diclofenac (VOLTAREN) 75 MG EC tablet Take by mouth.     gabapentin (NEURONTIN) 300 MG capsule Take by mouth.      omeprazole (PRILOSEC) 20 MG capsule Take by mouth.     pravastatin (PRAVACHOL) 20 MG tablet Take by mouth.     vitamin B-12 (CYANOCOBALAMIN) 500 MCG tablet Take by mouth.     No facility-administered medications prior to visit.    PAST MEDICAL HISTORY: Past Medical History:  Diagnosis Date   Chronic back pain    Chronic GERD     PAST SURGICAL HISTORY: Past Surgical History:  Procedure Laterality Date   SPINE SURGERY      FAMILY HISTORY: Family History  Problem Relation Age of Onset   Dementia Father     SOCIAL HISTORY: Social History   Socioeconomic History   Marital status: Married    Spouse name: Not on file   Number of children: Not on file   Years of education: Not on file   Highest education level: Not on file  Occupational History   Not on file  Tobacco Use   Smoking status: Never   Smokeless tobacco: Never  Substance and Sexual Activity   Alcohol use: Yes    Comment: rare   Drug use: Not on file   Sexual activity: Not on file  Other Topics Concern   Not on file  Social History Narrative   Retired    Investment banker, operational of Radio broadcast assistant Strain: Not  on file  Food Insecurity: Not on file  Transportation Needs: Not on file  Physical Activity: Not on file  Stress: Not on file  Social Connections: Not on file  Intimate Partner Violence: Not on file    PHYSICAL EXAM  GENERAL EXAM/CONSTITUTIONAL: Vitals:  Vitals:   01/15/21 1255  BP: 113/69  Pulse: (!) 58  Weight: 158 lb (71.7 kg)  Height: 5' 7" (1.702 m)   Body mass index is 24.75 kg/m. Wt Readings from Last 3 Encounters:  01/15/21 158 lb (71.7 kg)   Patient is in no distress; well developed, nourished and groomed; neck is supple  EYES: Pupils round and reactive to light, Visual fields full to confrontation, Extraocular movements intacts,   MUSCULOSKELETAL: Gait, strength, tone, movements noted in Neurologic exam below  NEUROLOGIC: MENTAL STATUS:  No flowsheet  data found. awake, alert, oriented to person, place and time recent and remote memory intact normal attention and concentration language fluent, comprehension intact, naming intact fund of knowledge appropriate  CRANIAL NERVE:  2nd, 3rd, 4th, 6th - pupils equal and reactive to light, visual fields full to confrontation, extraocular muscles intact, no nystagmus 5th - facial sensation symmetric 7th - facial strength symmetric 8th - hearing intact 9th - palate elevates symmetrically, uvula midline 11th - shoulder shrug symmetric 12th - tongue protrusion midline  MOTOR:  normal bulk and tone, full strength in the BUE, BLE  SENSORY:  Slight decrease sensation to light touch, pinprick in the left mid shin to ankle area when compared to the right. Vibration and proprioception is normal  COORDINATION:  finger-nose-finger, fine finger movements normal  REFLEXES:  deep tendon reflexes present and symmetric  GAIT/STATION:  normal   DIAGNOSTIC DATA (LABS, IMAGING, TESTING) - I reviewed patient records, labs, notes, testing and imaging myself where available.  No results found for: WBC, HGB, HCT, MCV, PLT No results found for: NA, K, CL, CO2, GLUCOSE, BUN, CREATININE, CALCIUM, PROT, ALBUMIN, AST, ALT, ALKPHOS, BILITOT, GFRNONAA, GFRAA No results found for: CHOL, HDL, LDLCALC, LDLDIRECT, TRIG, CHOLHDL No results found for: HGBA1C No results found for: VITAMINB12 No results found for: TSH    ASSESSMENT AND PLAN  70 y.o. year old male with past medical history of chronic back pain, hyperlipidemia who is presenting with 7 to 73-month history of pain and numbness in the bilateral lower extremity worse on the left from mid shin to ankle.  Pain is described as "someone sticking a needle in the skin".  Patient stated the symptoms started after his second surgery, he is on gabapentin 300 mg 3 times daily but no improvement of his symptoms.  I will start him on Lyrica 75 mg nightly then can  increase to 75 mg twice daily and even can increase to 150 mg twice daily as tolerated.  I will also obtain neuropathy lab and bring him in 3 months for follow-up.   He also mentions some memory decline but we will address that at next visit.   1. Neuropathy     PLAN: Start with Lyrica 75 mg nightly, then increase to 75 mg twice daily.  Can increase to 2 tab (150 mg) twice a day as tolerated  Trial of Tizanidine for the back pain  Will get neuropathy labs today  Return in 3 months   Orders Placed This Encounter  Procedures   CBC with diff   CMP   Vitamin B12   MMA   Homocysteine   A1c   TSH   SPEP with  IFE   ANA w/Reflex   SSA, SSB   ESR   CRP     Meds ordered this encounter  Medications   pregabalin (LYRICA) 75 MG capsule    Sig: Take 1 capsule (75 mg total) by mouth 3 (three) times daily.    Dispense:  90 capsule    Refill:  0   tiZANidine (ZANAFLEX) 4 MG tablet    Sig: Take 1 tablet (4 mg total) by mouth every 8 (eight) hours as needed for muscle spasms.    Dispense:  15 tablet    Refill:  0     Return in about 3 months (around 04/17/2021).    Alric Ran, MD 01/15/2021, 1:45 PM  [facility] [address], [place], [place] [address] [phone]

## 2021-01-16 ENCOUNTER — Telehealth: Payer: Self-pay | Admitting: *Deleted

## 2021-01-16 NOTE — Telephone Encounter (Signed)
Submitted PA Lyrica on CMM. Key: [id number]. Waiting on determination from [facility].

## 2021-01-16 NOTE — Telephone Encounter (Signed)
Received fax from [employer] that PA approved 03/23/20-03/22/21.  

## 2021-01-20 LAB — COMPREHENSIVE METABOLIC PANEL
ALT: 16 IU/L (ref 0–44)
AST: 19 IU/L (ref 0–40)
Albumin/Globulin Ratio: 2 (ref 1.2–2.2)
Albumin: 4.4 g/dL (ref 3.7–4.7)
Alkaline Phosphatase: 78 IU/L (ref 44–121)
BUN/Creatinine Ratio: 17 (ref 10–24)
BUN: 17 mg/dL (ref 8–27)
Bilirubin Total: 0.3 mg/dL (ref 0.0–1.2)
CO2: 27 mmol/L (ref 20–29)
Calcium: 9.2 mg/dL (ref 8.6–10.2)
Chloride: 103 mmol/L (ref 96–106)
Creatinine, Ser: 0.99 mg/dL (ref 0.76–1.27)
Globulin, Total: 2.2 g/dL (ref 1.5–4.5)
Glucose: 79 mg/dL (ref 70–99)
Potassium: 4.4 mmol/L (ref 3.5–5.2)
Sodium: 143 mmol/L (ref 134–144)
Total Protein: 6.6 g/dL (ref 6.0–8.5)
eGFR: 81 mL/min/{1.73_m2} (ref >59)

## 2021-01-20 LAB — CBC WITH DIFFERENTIAL/PLATELET
Basophils Absolute: 0.1 10*3/uL (ref 0.0–0.2)
Basos: 1 %
EOS (ABSOLUTE): 0.2 10*3/uL (ref 0.0–0.4)
Eos: 3 %
Hematocrit: 42.9 % (ref 37.5–51.0)
Hemoglobin: 14.3 g/dL (ref 13.0–17.7)
Immature Grans (Abs): 0 10*3/uL (ref 0.0–0.1)
Immature Granulocytes: 0 %
Lymphocytes Absolute: 2.7 10*3/uL (ref 0.7–3.1)
Lymphs: 47 %
MCH: 31.2 pg (ref 26.6–33.0)
MCHC: 33.3 g/dL (ref 31.5–35.7)
MCV: 94 fL (ref 79–97)
Monocytes Absolute: 0.4 10*3/uL (ref 0.1–0.9)
Monocytes: 7 %
Neutrophils Absolute: 2.4 10*3/uL (ref 1.4–7.0)
Neutrophils: 42 %
Platelets: 166 10*3/uL (ref 150–450)
RBC: 4.58 x10E6/uL (ref 4.14–5.80)
RDW: 12.9 % (ref 11.6–15.4)
WBC: 5.7 10*3/uL (ref 3.4–10.8)

## 2021-01-20 LAB — SJOGREN'S SYNDROME ANTIBODS(SSA + SSB)
ENA SSA (RO) Ab: <0.2 AI (ref 0.0–0.9)
ENA SSB (LA) Ab: <0.2 AI (ref 0.0–0.9)

## 2021-01-20 LAB — MULTIPLE MYELOMA PANEL, SERUM
Albumin SerPl Elph-Mcnc: 4 g/dL (ref 2.9–4.4)
Albumin/Glob SerPl: 1.6 (ref 0.7–1.7)
Alpha 1: 0.2 g/dL (ref 0.0–0.4)
Alpha2 Glob SerPl Elph-Mcnc: 0.8 g/dL (ref 0.4–1.0)
B-Globulin SerPl Elph-Mcnc: 1 g/dL (ref 0.7–1.3)
Gamma Glob SerPl Elph-Mcnc: 0.7 g/dL (ref 0.4–1.8)
Globulin, Total: 2.6 g/dL (ref 2.2–3.9)
IgA/Immunoglobulin A, Serum: 226 mg/dL (ref 61–437)
IgG (Immunoglobin G), Serum: 728 mg/dL (ref 603–1613)
IgM (Immunoglobulin M), Srm: 63 mg/dL (ref 15–143)

## 2021-01-20 LAB — TSH: TSH: 2.65 u[IU]/mL (ref 0.450–4.500)

## 2021-01-20 LAB — HOMOCYSTEINE: Homocysteine: 9.2 umol/L (ref 0.0–19.2)

## 2021-01-20 LAB — ANA W/REFLEX: ANA Titer 1: NEGATIVE

## 2021-01-20 LAB — SEDIMENTATION RATE: Sed Rate: 5 mm/hr (ref 0–30)

## 2021-01-20 LAB — C-REACTIVE PROTEIN: CRP: 1 mg/L (ref 0–10)

## 2021-01-20 LAB — HEMOGLOBIN A1C
Est. average glucose Bld gHb Est-mCnc: 114 mg/dL
Hgb A1c MFr Bld: 5.6 % (ref 4.8–5.6)

## 2021-01-20 LAB — VITAMIN B12: Vitamin B-12: 1689 pg/mL — ABNORMAL HIGH (ref 232–1245)

## 2021-01-20 LAB — METHYLMALONIC ACID, SERUM: Methylmalonic Acid: 100 nmol/L (ref 0–378)

## 2021-01-20 NOTE — Progress Notes (Signed)
Please call and advise the patient that the recent labs we checked were within normal limits. We checked  vitamin B12 level, cell count, blood sugar, diabetes marker, electrolytes, kidney function, liver function, thyroid function, inflammatory markers, sedimentation rate. No further action is required on these tests at this time. Please remind patient to keep any upcoming appointments or tests and to call us with any interim questions, concerns, problems or updates. Thanks,   Windell Norfolk, MD

## 2021-01-21 ENCOUNTER — Telehealth: Payer: Self-pay

## 2021-01-21 NOTE — Telephone Encounter (Signed)
-----   Message from Windell Norfolk, MD sent at 01/20/2021  8:58 PM EDT ----- Please call and advise the patient that the recent labs we checked were within normal limits. We checked  vitamin B12 level, cell count, blood sugar, diabetes marker, electrolytes, kidney function, liver function, thyroid function, inflammatory markers, sedimentation rate. No further action is required on these tests at this time. Please remind patient to keep any upcoming appointments or tests and to call us with any interim questions, concerns, problems or updates. Thanks,   Windell Norfolk, MD

## 2021-01-21 NOTE — Telephone Encounter (Signed)
I called patient. I spoke with patient's wife, [name], per DPR. I advised her of patient's lab results. Patient's wife reports that patient has started lyrica and he is tolerating it well except for some drowsiness at night time. I advised patient's wife that he should not drive when drowsy or fatigued. Patient's wife verbalized understanding of results and recommendations.

## 2021-01-29 DIAGNOSIS — L820 Inflamed seborrheic keratosis: Secondary | ICD-10-CM | POA: Diagnosis not present

## 2021-02-03 ENCOUNTER — Telehealth: Payer: Self-pay | Admitting: Neurology

## 2021-02-03 NOTE — Telephone Encounter (Signed)
Spoke to patient and wife. He did not feel tizanidine was helpful and stopped it. He is still symptomatic. Reports only taking pregabalin 75mg , one capsule at bedtime which is providing some benefit. He is still having issues during the day. The rx was written for one capsule TID. He has not increased the dose due to concerns over potential adverse side effects. I encouraged him to add one capsule in the morning for a few days. If tolerated well, then he may add another dose midday. He was agreeable.

## 2021-02-03 NOTE — Telephone Encounter (Signed)
FYI- Pt's wife called states her husband has stopped taking the tiZANidine (ZANAFLEX) 4 MG tablet because it was not working and giving him stomach issues. However he is still taking the pregabalin (LYRICA) 75 MG capsule once a day.

## 2021-03-11 ENCOUNTER — Other Ambulatory Visit: Payer: Self-pay | Admitting: Neurology

## 2021-03-13 DIAGNOSIS — J329 Chronic sinusitis, unspecified: Secondary | ICD-10-CM | POA: Diagnosis not present

## 2021-03-13 DIAGNOSIS — J40 Bronchitis, not specified as acute or chronic: Secondary | ICD-10-CM | POA: Diagnosis not present

## 2021-03-20 DIAGNOSIS — H6093 Unspecified otitis externa, bilateral: Secondary | ICD-10-CM | POA: Diagnosis not present

## 2021-03-20 DIAGNOSIS — J069 Acute upper respiratory infection, unspecified: Secondary | ICD-10-CM | POA: Diagnosis not present

## 2021-03-20 DIAGNOSIS — H9190 Unspecified hearing loss, unspecified ear: Secondary | ICD-10-CM | POA: Diagnosis not present

## 2021-03-20 DIAGNOSIS — H61303 Acquired stenosis of external ear canal, unspecified, bilateral: Secondary | ICD-10-CM | POA: Diagnosis not present

## 2021-03-20 DIAGNOSIS — H6123 Impacted cerumen, bilateral: Secondary | ICD-10-CM | POA: Diagnosis not present

## 2021-04-14 DIAGNOSIS — H43813 Vitreous degeneration, bilateral: Secondary | ICD-10-CM | POA: Diagnosis not present

## 2021-04-14 DIAGNOSIS — H33322 Round hole, left eye: Secondary | ICD-10-CM | POA: Diagnosis not present

## 2021-04-17 ENCOUNTER — Ambulatory Visit: Payer: Medicare HMO | Admitting: Neurology

## 2021-04-17 ENCOUNTER — Encounter: Payer: Self-pay | Admitting: Neurology

## 2021-04-17 VITALS — Ht 67.0 in | Wt 163.0 lb

## 2021-04-17 DIAGNOSIS — G629 Polyneuropathy, unspecified: Secondary | ICD-10-CM | POA: Diagnosis not present

## 2021-04-17 DIAGNOSIS — G3184 Mild cognitive impairment, so stated: Secondary | ICD-10-CM

## 2021-04-17 MED ORDER — DONEPEZIL HCL 10 MG PO TABS: 10.0000 mg | ORAL_TABLET | Freq: Every day | ORAL | 11 refills | Status: DC

## 2021-04-17 NOTE — Progress Notes (Signed)
[facility]  PATIENT: Colin Edwards DOB: 1950-06-06  REFERRING CLINICIAN: Noni Saupe, MD HISTORY FROM: Patient and spouse  REASON FOR VISIT: Numbness in the lower extremities and memory deficit follow up   HISTORICAL  CHIEF COMPLAINT:  Chief Complaint  Patient presents with   Follow-up    Room 12 w/ his wife, [name]. He doses his pregabalin 75mg  based on his neuropathy symptoms (2-3 capsules per day). Also, expressing concerns over memory changes.     INTERVAL HISTORY 04/16/2021 Patient presents today for follow-up with his wife 04/18/2021, he said that neuropathy is better with the pregabalin 75 mg twice daily.  Still have occasional burning pain.  He is neuropathy labs were done and did not show any abnormalities.  He is also complaining of memory problem.  He think that he forgets very easily, he is easily distracted, he reports he will go outside to pick up something but will forget the reason he went outside in the first place.  Sometimes while driving to familiar places he will get confused but has not reported being lost and having to call his wife or using the GPS to go back home.  Denies any recent accident.  Per wife patient is easily distracted, he does not have issue with recent conversation. A year ago he was noted to be repeating himself numerous times, saying the same story over and over but since starting the Prevagen he has not been doing that anymore.  He still independent, remains active, wife is handling the finances.      HISTORY OF PRESENT ILLNESS:  This is a 72 year old gentleman with past medical history of back pain, hyperlipidemia, GERD who is presenting with complaint of burning sensation in his leg mostly the left leg from the mid shin to the ankle.  Patient stated in March 2020 he had back surgery due to severe chronic back pain, he had plate and screws placed he mentioned that the back pain did not resolve so 2 years later in March  2022 he had another second surgery done and after the surgery he noted the burning in the legs.  He described the pain as "someone sticking a knife in his left leg".  Symptoms are getting worse.  Burning sensation is constant.  Nothing seems to improve the pain.  He is on gabapentin 300 mg 3 times daily but no improvement.  He denies any weakness of the legs and denies any falls.     OTHER MEDICAL CONDITIONS: Chronic back pain, GERD, HLD   REVIEW OF SYSTEMS: Full 14 system review of systems performed and negative with exception of: as noted in the HPI  ALLERGIES: Allergies  Allergen Reactions   Augmentin [Amoxicillin-Pot Clavulanate]    Ciprofloxacin Nausea And Vomiting    Required IV fluid   Hydrocodone    Levaquin [Levofloxacin]    Morphine And Related    Sulfa Antibiotics    Tamsulosin    Tramadol     HOME MEDICATIONS: Outpatient Medications Prior to Visit  Medication Sig Dispense Refill   Apoaequorin (PREVAGEN PO) Take 1 tablet by mouth daily.     ascorbic acid (VITAMIN C) 500 MG tablet Take by mouth.     diclofenac (VOLTAREN) 75 MG EC tablet Take by mouth.     FIBER PO Take 2 tablets by mouth daily.     MELATONIN PO Take 10 mg by mouth at bedtime.     omeprazole (PRILOSEC) 20 MG capsule Take by mouth.  pravastatin (PRAVACHOL) 20 MG tablet Take by mouth.     pregabalin (LYRICA) 75 MG capsule TAKE 1 CAPSULE BY MOUTH THREE TIMES DAILY 90 capsule 0   vitamin B-12 (CYANOCOBALAMIN) 500 MCG tablet Take by mouth.     gabapentin (NEURONTIN) 300 MG capsule Take by mouth.     tiZANidine (ZANAFLEX) 4 MG tablet Take 1 tablet (4 mg total) by mouth every 8 (eight) hours as needed for muscle spasms. 15 tablet 0   No facility-administered medications prior to visit.    PAST MEDICAL HISTORY: Past Medical History:  Diagnosis Date   Chronic back pain    Chronic GERD     PAST SURGICAL HISTORY: Past Surgical History:  Procedure Laterality Date   SPINE SURGERY      FAMILY  HISTORY: Family History  Problem Relation Age of Onset   Dementia Father     SOCIAL HISTORY: Social History   Socioeconomic History   Marital status: Married    Spouse name: Not on file   Number of children: Not on file   Years of education: Not on file   Highest education level: Not on file  Occupational History   Not on file  Tobacco Use   Smoking status: Never   Smokeless tobacco: Never  Substance and Sexual Activity   Alcohol use: Yes    Comment: rare   Drug use: Not on file   Sexual activity: Not on file  Other Topics Concern   Not on file  Social History Narrative   Retired    International aid/development worker of [employer] Strain: Not on file  Food Insecurity: Not on file  Transportation Needs: Not on file  Physical Activity: Not on file  Stress: Not on file  Social Connections: Not on file  Intimate Partner Violence: Not on file    PHYSICAL EXAM  GENERAL EXAM/CONSTITUTIONAL: Vitals:  Vitals:   04/17/21 1249  Weight: 163 lb (73.9 kg)  Height: 5\' 7"  (1.702 m)   Body mass index is 25.53 kg/m. Wt Readings from Last 3 Encounters:  04/17/21 163 lb (73.9 kg)  01/15/21 158 lb (71.7 kg)   Patient is in no distress; well developed, nourished and groomed; neck is supple  EYES: Pupils round and reactive to light, Visual fields full to confrontation, Extraocular movements intacts,   MUSCULOSKELETAL: Gait, strength, tone, movements noted in Neurologic exam below  NEUROLOGIC: MENTAL STATUS:  MMSE - Mini Mental State Exam 04/17/2021  Orientation to time 4  Orientation to Place 5  Registration 3  Attention/ Calculation 1  Recall 2  Language- name 2 objects 2  Language- repeat 1  Language- follow 3 step command 3  Language- read & follow direction 1  Write a sentence 1  Copy design 0  Copy design-comments 4-legged animals x 1 min: 4  Total score 23    CRANIAL NERVE:  2nd, 3rd, 4th, 6th - pupils equal and reactive to light, visual fields full  to confrontation, extraocular muscles intact, no nystagmus 5th - facial sensation symmetric 7th - facial strength symmetric 8th - hearing intact 9th - palate elevates symmetrically, uvula midline 11th - shoulder shrug symmetric 12th - tongue protrusion midline  MOTOR:  normal bulk and tone, full strength in the BUE, BLE  SENSORY:  Slight decrease sensation to light touch, pinprick in the left mid shin to ankle area when compared to the right. Vibration and proprioception is normal  COORDINATION:  finger-nose-finger, fine finger movements normal  REFLEXES:  deep  tendon reflexes present and symmetric  GAIT/STATION:  normal   DIAGNOSTIC DATA (LABS, IMAGING, TESTING) - I reviewed patient records, labs, notes, testing and imaging myself where available.  Lab Results  Component Value Date   WBC 5.7 01/15/2021   HGB 14.3 01/15/2021   HCT 42.9 01/15/2021   MCV 94 01/15/2021   PLT 166 01/15/2021      Component Value Date/Time   NA 143 01/15/2021 1349   K 4.4 01/15/2021 1349   CL 103 01/15/2021 1349   CO2 27 01/15/2021 1349   GLUCOSE 79 01/15/2021 1349   BUN 17 01/15/2021 1349   CREATININE 0.99 01/15/2021 1349   CALCIUM 9.2 01/15/2021 1349   PROT 6.6 01/15/2021 1349   ALBUMIN 4.4 01/15/2021 1349   AST 19 01/15/2021 1349   ALT 16 01/15/2021 1349   ALKPHOS 78 01/15/2021 1349   BILITOT 0.3 01/15/2021 1349   No results found for: CHOL, HDL, LDLCALC, LDLDIRECT, TRIG, CHOLHDL Lab Results  Component Value Date   HGBA1C 5.6 01/15/2021   Lab Results  Component Value Date   VITAMINB12 1,689 (H) 01/15/2021   Lab Results  Component Value Date   TSH 2.650 01/15/2021      ASSESSMENT AND PLAN  71 y.o. year old male with past medical history of chronic back pain, hyperlipidemia who is presenting for follow up for his lower bilateral lower extremities neuropathy.  He is on pregabalin 75 mg twice daily with some improvement of his symptoms, advised the patient to increase it  to 150 mg twice daily as tolerated.   For his memory issue he complained of being easily distracted, and mild forgetfulness.  He scored a 23 out of 30 on the Mini-Mental status but only completed eighth grade of high school education.  He is already on Prevagen for the past year and wife reports some improvement of his memory.  I informed patient that he likely has mild cognitive impairment, I will start him on Aricept to take half tablet nightly for 1 week and then increase to full tablet thereafter.  Discussed side effect of bradycardia and vivid dreams.  Patient understands to call me if he experience any side effects.  I will see him in 1 year for follow-up.   1. Neuropathy   2. Mild cognitive impairment     Patient Instructions  Continue with Lyrica 75 mg twice daily, can increase to 150 mg twice daily as tolerated  Start with Aricept 1/2 nightly for one week then increase to full tab  Follow up with your PCP  Return in 1 year     There are well-accepted and sensible ways to reduce risk for Alzheimers disease and other degenerative brain disorders .  Exercise Daily Walk A daily 20 minute walk should be part of your routine. Disease related apathy can be a significant roadblock to exercise and the only way to overcome this is to make it a daily routine and perhaps have a reward at the end (something your loved one loves to eat or drink perhaps) or a personal trainer coming to the home can also be very useful. Most importantly, the patient is much more likely to exercise if the caregiver / spouse does it with him/her. In general a structured, repetitive schedule is best.  General Health: Any diseases which effect your body will effect your brain such as a pneumonia, urinary infection, blood clot, heart attack or stroke. Keep contact with your primary care doctor for regular follow ups.  Sleep.  A good nights sleep is healthy for the brain. Seven hours is recommended. If you have insomnia or  poor sleep habits we can give you some instructions. If you have sleep apnea wear your mask.  Diet: Eating a heart healthy diet is also a good idea; fish and poultry instead of red meat, nuts (mostly non-peanuts), vegetables, fruits, olive oil or canola oil (instead of butter), minimal salt (use other spices to flavor foods), whole grain rice, bread, cereal and pasta and wine in moderation.Research is now showing that the MIND diet, which is a combination of The Mediterranean diet and the DASH diet, is beneficial for cognitive processing and longevity. Information about this diet can be found in The MIND Diet, a book by Alonna MiniumMaggie Moon, MS, RDN, and online at WildWild[employer].eshttps://www.healthline.com/nutrition/mind-diet  Finances, Power of [address] and Advance Directives: You should consider putting legal safeguards in place with regard to financial and medical decision making. While the spouse always has power of attorney for medical and financial issues in the absence of any form, you should consider what you want in case the spouse / caregiver is no longer around or capable of making decisions.     Heart-head connection  New research shows there are things we can do to reduce the risk of mild cognitive impairment and dementia.  Several conditions known to increase the risk of cardiovascular disease -- such as high blood pressure, diabetes and high cholesterol -- also increase the risk of developing Alzheimer's. Some autopsy studies show that as many as 80 percent of individuals with Alzheimer's disease also have cardiovascular disease.  A longstanding question is why some people develop hallmark Alzheimer's plaques and tangles but do not develop the symptoms of Alzheimer's. Vascular disease may help researchers eventually find an answer. Some autopsy studies suggest that plaques and tangles may be present in the brain without causing symptoms of cognitive decline unless the brain also shows evidence of vascular  disease. More research is needed to better understand the link between vascular health and Alzheimer's.  Physical exercise and diet Regular physical exercise may be a beneficial strategy to lower the risk of Alzheimer's and vascular dementia. Exercise may directly benefit brain cells by increasing blood and oxygen flow in the brain. Because of its known cardiovascular benefits, a medically approved exercise program is a valuable part of any overall wellness plan.  Current evidence suggests that heart-healthy eating may also help protect the brain. Heart-healthy eating includes limiting the intake of sugar and saturated fats and making sure to eat plenty of fruits, vegetables, and whole grains. No one diet is best. Two diets that have been studied and may be beneficial are the DASH (Dietary Approaches to Stop Hypertension) diet and the Mediterranean diet. The DASH diet emphasizes vegetables, fruits and fat-free or low-fat dairy products; includes whole grains, fish, poultry, beans, seeds, nuts and vegetable oils; and limits sodium, sweets, sugary beverages and red meats. A Mediterranean diet includes relatively little red meat and emphasizes whole grains, fruits and vegetables, fish and shellfish, and nuts, olive oil and other healthy fats.  Social connections and intellectual activity A number of studies indicate that maintaining strong social connections and keeping mentally active as we age might lower the risk of cognitive decline and Alzheimer's. Experts are not certain about the reason for this association. It may be due to direct mechanisms through which social and mental stimulation strengthen connections between nerve cells in the brain.  Head trauma There appears to be a strong link  between future risk of Alzheimer's and serious head trauma, especially when injury involves loss of consciousness. You can help reduce your risk of Alzheimer's by protecting your head.  Wear a seat belt  Use a  helmet when participating in sports  "Fall-proof" your home   What you can do now While research is not yet conclusive, certain lifestyle choices, such as physical activity and diet, may help support brain health and prevent Alzheimer's. Many of these lifestyle changes have been shown to lower the risk of other diseases, like heart disease and diabetes, which have been linked to Alzheimer's. With few drawbacks and plenty of known benefits, healthy lifestyle choices can improve your health and possibly protect your brain.  Learn more about brain health. You can help increase our knowledge by considering participation in a clinical study. Our free clinical trial matching services, TrialMatch, can help you find clinical trials in your area that are seeking volunteers.      No orders of the defined types were placed in this encounter.    Meds ordered this encounter  Medications   donepezil (ARICEPT) 10 MG tablet    Sig: Take 1 tablet (10 mg total) by mouth at bedtime.    Dispense:  30 tablet    Refill:  11     Return in about 1 year (around 04/17/2022).    Windell NorfolkAmadou Braylinn Gulden, MD 04/17/2021, 1:38 PM  [facility] [address], [address] [place], [place] [id number] [phone]

## 2021-04-17 NOTE — Patient Instructions (Addendum)
Continue with Lyrica 75 mg twice daily, can increase to 150 mg twice daily as tolerated  Start with Aricept 1/2 nightly for one week then increase to full tab  Follow up with your PCP  Return in 1 year     There are well-accepted and sensible ways to reduce risk for Alzheimers disease and other degenerative brain disorders .  Exercise Daily Walk A daily 20 minute walk should be part of your routine. Disease related apathy can be a significant roadblock to exercise and the only way to overcome this is to make it a daily routine and perhaps have a reward at the end (something your loved one loves to eat or drink perhaps) or a personal trainer coming to the home can also be very useful. Most importantly, the patient is much more likely to exercise if the caregiver / spouse does it with him/her. In general a structured, repetitive schedule is best.  General Health: Any diseases which effect your body will effect your brain such as a pneumonia, urinary infection, blood clot, heart attack or stroke. Keep contact with your primary care doctor for regular follow ups.  Sleep. A good nights sleep is healthy for the brain. Seven hours is recommended. If you have insomnia or poor sleep habits we can give you some instructions. If you have sleep apnea wear your mask.  Diet: Eating a heart healthy diet is also a good idea; fish and poultry instead of red meat, nuts (mostly non-peanuts), vegetables, fruits, olive oil or canola oil (instead of butter), minimal salt (use other spices to flavor foods), whole grain rice, bread, cereal and pasta and wine in moderation.Research is now showing that the MIND diet, which is a combination of The Mediterranean diet and the DASH diet, is beneficial for cognitive processing and longevity. Information about this diet can be found in The MIND Diet, a book by Alonna Minium, MS, RDN, and online at WildWild[employer].es  Finances, Power of [address] and  Advance Directives: You should consider putting legal safeguards in place with regard to financial and medical decision making. While the spouse always has power of attorney for medical and financial issues in the absence of any form, you should consider what you want in case the spouse / caregiver is no longer around or capable of making decisions.     Heart-head connection  New research shows there are things we can do to reduce the risk of mild cognitive impairment and dementia.  Several conditions known to increase the risk of cardiovascular disease -- such as high blood pressure, diabetes and high cholesterol -- also increase the risk of developing Alzheimer's. Some autopsy studies show that as many as 80 percent of individuals with Alzheimer's disease also have cardiovascular disease.  A longstanding question is why some people develop hallmark Alzheimer's plaques and tangles but do not develop the symptoms of Alzheimer's. Vascular disease may help researchers eventually find an answer. Some autopsy studies suggest that plaques and tangles may be present in the brain without causing symptoms of cognitive decline unless the brain also shows evidence of vascular disease. More research is needed to better understand the link between vascular health and Alzheimer's.  Physical exercise and diet Regular physical exercise may be a beneficial strategy to lower the risk of Alzheimer's and vascular dementia. Exercise may directly benefit brain cells by increasing blood and oxygen flow in the brain. Because of its known cardiovascular benefits, a medically approved exercise program is a valuable part of any overall  wellness plan.  Current evidence suggests that heart-healthy eating may also help protect the brain. Heart-healthy eating includes limiting the intake of sugar and saturated fats and making sure to eat plenty of fruits, vegetables, and whole grains. No one diet is best. Two diets that have been  studied and may be beneficial are the DASH (Dietary Approaches to Stop Hypertension) diet and the Mediterranean diet. The DASH diet emphasizes vegetables, fruits and fat-free or low-fat dairy products; includes whole grains, fish, poultry, beans, seeds, nuts and vegetable oils; and limits sodium, sweets, sugary beverages and red meats. A Mediterranean diet includes relatively little red meat and emphasizes whole grains, fruits and vegetables, fish and shellfish, and nuts, olive oil and other healthy fats.  Social connections and intellectual activity A number of studies indicate that maintaining strong social connections and keeping mentally active as we age might lower the risk of cognitive decline and Alzheimer's. Experts are not certain about the reason for this association. It may be due to direct mechanisms through which social and mental stimulation strengthen connections between nerve cells in the brain.  Head trauma There appears to be a strong link between future risk of Alzheimer's and serious head trauma, especially when injury involves loss of consciousness. You can help reduce your risk of Alzheimer's by protecting your head.  Wear a seat belt  Use a helmet when participating in sports  "Fall-proof" your home   What you can do now While research is not yet conclusive, certain lifestyle choices, such as physical activity and diet, may help support brain health and prevent Alzheimer's. Many of these lifestyle changes have been shown to lower the risk of other diseases, like heart disease and diabetes, which have been linked to Alzheimer's. With few drawbacks and plenty of known benefits, healthy lifestyle choices can improve your health and possibly protect your brain.  Learn more about brain health. You can help increase our knowledge by considering participation in a clinical study. Our free clinical trial matching services, TrialMatch, can help you find clinical trials in your area that  are seeking volunteers.

## 2021-04-22 DIAGNOSIS — R21 Rash and other nonspecific skin eruption: Secondary | ICD-10-CM | POA: Diagnosis not present

## 2021-04-23 ENCOUNTER — Other Ambulatory Visit: Payer: Self-pay | Admitting: Neurology

## 2021-04-23 NOTE — Telephone Encounter (Signed)
This request is for 75 mg TID but Dr Karie Georges note from 04/17/21 states pt is taking 75 mg BID. I called the pt to clarify. Left office number in message for call back.

## 2021-04-23 NOTE — Telephone Encounter (Signed)
Pt has called Toma Copier, RN back.  She would like a call back

## 2021-04-24 NOTE — Telephone Encounter (Signed)
Last seen pt 04-14-2021.  You stated in your note: Continue with Lyrica 75 mg twice daily, can increase to 150 mg twice daily as tolerated.  Did you want to what was gvien last time?? 75mg  TID??  [place] drug registry checked last fill 03/11/21 #90

## 2021-04-24 NOTE — Telephone Encounter (Signed)
He is taking it BID but I wrote it as TID to give him extra dose in case he needs it.

## 2021-05-05 DIAGNOSIS — B354 Tinea corporis: Secondary | ICD-10-CM | POA: Diagnosis not present

## 2021-05-05 DIAGNOSIS — Z6825 Body mass index (BMI) 25.0-25.9, adult: Secondary | ICD-10-CM | POA: Diagnosis not present

## 2021-05-05 DIAGNOSIS — K219 Gastro-esophageal reflux disease without esophagitis: Secondary | ICD-10-CM | POA: Diagnosis not present

## 2021-05-05 DIAGNOSIS — J209 Acute bronchitis, unspecified: Secondary | ICD-10-CM | POA: Diagnosis not present

## 2021-05-14 DIAGNOSIS — H33322 Round hole, left eye: Secondary | ICD-10-CM | POA: Diagnosis not present

## 2021-05-14 DIAGNOSIS — H43813 Vitreous degeneration, bilateral: Secondary | ICD-10-CM | POA: Diagnosis not present

## 2021-05-19 DIAGNOSIS — L820 Inflamed seborrheic keratosis: Secondary | ICD-10-CM | POA: Diagnosis not present

## 2021-05-19 DIAGNOSIS — L570 Actinic keratosis: Secondary | ICD-10-CM | POA: Diagnosis not present

## 2021-05-19 DIAGNOSIS — L299 Pruritus, unspecified: Secondary | ICD-10-CM | POA: Diagnosis not present

## 2021-05-19 DIAGNOSIS — L920 Granuloma annulare: Secondary | ICD-10-CM | POA: Diagnosis not present

## 2021-05-29 DIAGNOSIS — H33322 Round hole, left eye: Secondary | ICD-10-CM | POA: Diagnosis not present

## 2021-06-25 DIAGNOSIS — M4327 Fusion of spine, lumbosacral region: Secondary | ICD-10-CM | POA: Diagnosis not present

## 2021-06-25 DIAGNOSIS — M549 Dorsalgia, unspecified: Secondary | ICD-10-CM | POA: Diagnosis not present

## 2021-06-26 DIAGNOSIS — H33322 Round hole, left eye: Secondary | ICD-10-CM | POA: Diagnosis not present

## 2021-06-26 DIAGNOSIS — H25812 Combined forms of age-related cataract, left eye: Secondary | ICD-10-CM | POA: Diagnosis not present

## 2021-06-26 DIAGNOSIS — H43813 Vitreous degeneration, bilateral: Secondary | ICD-10-CM | POA: Diagnosis not present

## 2021-08-28 DIAGNOSIS — H43813 Vitreous degeneration, bilateral: Secondary | ICD-10-CM | POA: Diagnosis not present

## 2021-08-28 DIAGNOSIS — H33322 Round hole, left eye: Secondary | ICD-10-CM | POA: Diagnosis not present

## 2021-10-27 DIAGNOSIS — R051 Acute cough: Secondary | ICD-10-CM | POA: Diagnosis not present

## 2021-10-27 DIAGNOSIS — J069 Acute upper respiratory infection, unspecified: Secondary | ICD-10-CM | POA: Diagnosis not present

## 2021-10-28 ENCOUNTER — Other Ambulatory Visit: Payer: Self-pay | Admitting: Neurology

## 2021-10-28 NOTE — Telephone Encounter (Signed)
Verify Drug Registry For Pregabalin 75 Mg Capsule Last Filled: 09/13/2021 Quantity: 90 tablets for 30 days Last appointment: 04/17/2021 Next appointment: 04/20/2022

## 2021-11-04 DIAGNOSIS — S80862A Insect bite (nonvenomous), left lower leg, initial encounter: Secondary | ICD-10-CM | POA: Diagnosis not present

## 2021-12-02 ENCOUNTER — Other Ambulatory Visit: Payer: Self-pay | Admitting: Neurology

## 2021-12-04 NOTE — Telephone Encounter (Signed)
Patient is requesting a 90 day supply of lyrica. Patient is up to date on his appointments. [place] Controlled Substance Registry checked and is appropriate.

## 2021-12-23 DIAGNOSIS — Z0000 Encounter for general adult medical examination without abnormal findings: Secondary | ICD-10-CM | POA: Diagnosis not present

## 2021-12-23 DIAGNOSIS — Z23 Encounter for immunization: Secondary | ICD-10-CM | POA: Diagnosis not present

## 2021-12-23 DIAGNOSIS — Z6825 Body mass index (BMI) 25.0-25.9, adult: Secondary | ICD-10-CM | POA: Diagnosis not present

## 2021-12-23 DIAGNOSIS — Z1331 Encounter for screening for depression: Secondary | ICD-10-CM | POA: Diagnosis not present

## 2021-12-23 DIAGNOSIS — Z79899 Other long term (current) drug therapy: Secondary | ICD-10-CM | POA: Diagnosis not present

## 2022-02-03 DIAGNOSIS — J069 Acute upper respiratory infection, unspecified: Secondary | ICD-10-CM | POA: Diagnosis not present

## 2022-02-03 DIAGNOSIS — R051 Acute cough: Secondary | ICD-10-CM | POA: Diagnosis not present

## 2022-02-24 DIAGNOSIS — L02415 Cutaneous abscess of right lower limb: Secondary | ICD-10-CM | POA: Diagnosis not present

## 2022-03-05 ENCOUNTER — Other Ambulatory Visit: Payer: Self-pay | Admitting: Neurology

## 2022-03-20 DIAGNOSIS — H9190 Unspecified hearing loss, unspecified ear: Secondary | ICD-10-CM | POA: Diagnosis not present

## 2022-03-20 DIAGNOSIS — H6123 Impacted cerumen, bilateral: Secondary | ICD-10-CM | POA: Diagnosis not present

## 2022-03-20 DIAGNOSIS — H61303 Acquired stenosis of external ear canal, unspecified, bilateral: Secondary | ICD-10-CM | POA: Diagnosis not present

## 2022-03-24 DIAGNOSIS — L821 Other seborrheic keratosis: Secondary | ICD-10-CM | POA: Diagnosis not present

## 2022-03-24 DIAGNOSIS — B354 Tinea corporis: Secondary | ICD-10-CM | POA: Diagnosis not present

## 2022-03-24 DIAGNOSIS — L219 Seborrheic dermatitis, unspecified: Secondary | ICD-10-CM | POA: Diagnosis not present

## 2022-03-30 DIAGNOSIS — H903 Sensorineural hearing loss, bilateral: Secondary | ICD-10-CM | POA: Diagnosis not present

## 2022-03-30 DIAGNOSIS — Z8669 Personal history of other diseases of the nervous system and sense organs: Secondary | ICD-10-CM | POA: Diagnosis not present

## 2022-03-30 DIAGNOSIS — H61303 Acquired stenosis of external ear canal, unspecified, bilateral: Secondary | ICD-10-CM | POA: Diagnosis not present

## 2022-03-31 DIAGNOSIS — R21 Rash and other nonspecific skin eruption: Secondary | ICD-10-CM | POA: Diagnosis not present

## 2022-03-31 DIAGNOSIS — R1011 Right upper quadrant pain: Secondary | ICD-10-CM | POA: Diagnosis not present

## 2022-03-31 DIAGNOSIS — Z6825 Body mass index (BMI) 25.0-25.9, adult: Secondary | ICD-10-CM | POA: Diagnosis not present

## 2022-04-08 DIAGNOSIS — R3981 Functional urinary incontinence: Secondary | ICD-10-CM | POA: Diagnosis not present

## 2022-04-16 DIAGNOSIS — R972 Elevated prostate specific antigen [PSA]: Secondary | ICD-10-CM | POA: Diagnosis not present

## 2022-04-16 DIAGNOSIS — Z79899 Other long term (current) drug therapy: Secondary | ICD-10-CM | POA: Diagnosis not present

## 2022-04-16 DIAGNOSIS — N393 Stress incontinence (female) (male): Secondary | ICD-10-CM | POA: Diagnosis not present

## 2022-04-16 DIAGNOSIS — N401 Enlarged prostate with lower urinary tract symptoms: Secondary | ICD-10-CM | POA: Diagnosis not present

## 2022-04-16 DIAGNOSIS — Z125 Encounter for screening for malignant neoplasm of prostate: Secondary | ICD-10-CM | POA: Diagnosis not present

## 2022-04-20 ENCOUNTER — Ambulatory Visit: Payer: Medicare HMO | Admitting: Neurology

## 2022-04-20 ENCOUNTER — Encounter: Payer: Self-pay | Admitting: Neurology

## 2022-04-20 NOTE — Progress Notes (Signed)
MRI Brain without contrast completed on 04/15/2022 at [facility] No acute intracranial abnormality

## 2022-04-22 ENCOUNTER — Ambulatory Visit: Payer: Medicare HMO | Admitting: Neurology

## 2022-04-22 ENCOUNTER — Encounter: Payer: Self-pay | Admitting: Neurology

## 2022-04-22 VITALS — BP 111/65 | HR 60 | Ht 67.0 in | Wt 163.0 lb

## 2022-04-22 DIAGNOSIS — R519 Headache, unspecified: Secondary | ICD-10-CM | POA: Diagnosis not present

## 2022-04-22 DIAGNOSIS — G3184 Mild cognitive impairment, so stated: Secondary | ICD-10-CM | POA: Diagnosis not present

## 2022-04-22 DIAGNOSIS — G629 Polyneuropathy, unspecified: Secondary | ICD-10-CM | POA: Diagnosis not present

## 2022-04-22 MED ORDER — PREGABALIN 150 MG PO CAPS: 150.0000 mg | ORAL_CAPSULE | Freq: Two times a day (BID) | ORAL | 4 refills | Status: DC

## 2022-04-22 NOTE — Progress Notes (Signed)
[facility]  PATIENT: Colin Edwards DOB: December 17, 1950  REFERRING CLINICIAN: Angelina Sheriff, MD HISTORY FROM: Patient and spouse  REASON FOR VISIT: Numbness in the lower extremities and memory deficit follow up   HISTORICAL  CHIEF COMPLAINT:  Chief Complaint  Patient presents with   Follow-up    Rm 13, with Colin  Stopped Aricept due to GI issues, memory stable,  C/o daily headaches     INTERVAL HISTORY 04/22/2022:  Patient presents today for follow-up, he is accompanied by Colin Edwards.  At last visit we have started him on Aricept for his memory problem.  He did start it for few days but have GI side effects therefore he self discontinued the medication.  He still on Prevagen, stated that the memory is stable.  He still lives with his Colin, still able to drive to familiar places, no recent accident and has not been lost.  He does help around but wife do most of the activities of daily living.  He remains forgetful and also have word finding difficulties. Today he is complaining of daily headaches, wakes up the headaches. Headaches usually starts in the back of the head and radiates to the front.    INTERVAL HISTORY 04/16/2021 Patient presents today for follow-up with his Colin Edwards, he said that neuropathy is better with the pregabalin 75 mg twice daily.  Still have occasional burning pain.  He is neuropathy labs were done and did not show any abnormalities.  He is also complaining of memory problem.  He think that he forgets very easily, he is easily distracted, he reports he will go outside to pick up something but will forget the reason he went outside in the first place.  Sometimes while driving to familiar places he will get confused but has not reported being lost and having to call his Colin or using the GPS to go back home.  Denies any recent accident.  Per Colin patient is easily distracted, he does not have issue with recent conversation. A year ago he was  noted to be repeating himself numerous times, saying the same story over and over but since starting the [place] he has not been doing that anymore.  He still independent, remains active, Colin is handling the finances.     HISTORY OF PRESENT ILLNESS:  This is a 72 year old gentleman with past medical history of back pain, hyperlipidemia, GERD who is presenting with complaint of burning sensation in his leg mostly the left leg from the mid shin to the ankle.  Patient stated in March 2020 he had back surgery due to severe chronic back pain, he had plate and screws placed he mentioned that the back pain did not resolve so 2 years later in March 2022 he had another second surgery done and after the surgery he noted the burning in the legs.  He described the pain as "someone sticking a knife in his left leg".  Symptoms are getting worse.  Burning sensation is constant.  Nothing seems to improve the pain.  He is on gabapentin 300 mg 3 times daily but no improvement.  He denies any weakness of the legs and denies any falls.     OTHER MEDICAL CONDITIONS: Chronic back pain, GERD, HLD   REVIEW OF SYSTEMS: Full 14 system review of systems performed and negative with exception of: as noted in the HPI  ALLERGIES: Allergies  Allergen Reactions   Augmentin [Amoxicillin-Pot Clavulanate]    Ciprofloxacin Nausea And Vomiting  Required IV fluid   Hydrocodone    Levaquin [Levofloxacin]    Morphine And Related    Sulfa Antibiotics    Tamsulosin    Tramadol     HOME MEDICATIONS: Outpatient Medications Prior to Visit  Medication Sig Dispense Refill   Apoaequorin (PREVAGEN PO) Take 1 tablet by mouth daily.     ascorbic acid (VITAMIN C) 500 MG tablet Take by mouth.     famotidine (PEPCID) 40 MG tablet Take 40 mg by mouth at bedtime.     FIBER PO Take 2 tablets by mouth daily.     MELATONIN PO Take 10 mg by mouth at bedtime.     omeprazole (PRILOSEC) 20 MG capsule Take by mouth.     pravastatin  (PRAVACHOL) 20 MG tablet Take by mouth.     pregabalin (LYRICA) 75 MG capsule TAKE 1 CAPSULE BY MOUTH THREE TIMES DAILY 270 capsule 0   tamsulosin (FLOMAX) 0.4 MG CAPS capsule Take 0.4 mg by mouth at bedtime.     vitamin B-12 (CYANOCOBALAMIN) 500 MCG tablet Take by mouth.     diclofenac (VOLTAREN) 75 MG EC tablet Take by mouth.     donepezil (ARICEPT) 10 MG tablet Take 1 tablet (10 mg total) by mouth at bedtime. 30 tablet 11   No facility-administered medications prior to visit.    PAST MEDICAL HISTORY: Past Medical History:  Diagnosis Date   Chronic back pain    Chronic GERD     PAST SURGICAL HISTORY: Past Surgical History:  Procedure Laterality Date   SPINE SURGERY      FAMILY HISTORY: Family History  Problem Relation Age of Onset   Dementia Father     SOCIAL HISTORY: Social History   Socioeconomic History   Marital status: Married    Spouse name: Not on file   Number of children: Not on file   Years of education: Not on file   Highest education level: Not on file  Occupational History   Not on file  Tobacco Use   Smoking status: Never   Smokeless tobacco: Never  Substance and Sexual Activity   Alcohol use: Yes    Comment: rare   Drug use: Not on file   Sexual activity: Not on file  Other Topics Concern   Not on file  Social History Narrative   Retired    International aid/development worker of [employer] Strain: Not on file  Food Insecurity: Not on file  Transportation Needs: Not on file  Physical Activity: Not on file  Stress: Not on file  Social Connections: Not on file  Intimate Partner Violence: Not on file    PHYSICAL EXAM  GENERAL EXAM/CONSTITUTIONAL: Vitals:  Vitals:   04/22/22 1114  BP: 111/65  Pulse: 60  Weight: 163 lb (73.9 kg)  Height: 5\' 7"  (1.702 m)    Body mass index is 25.53 kg/m. Wt Readings from Last 3 Encounters:  04/22/22 163 lb (73.9 kg)  04/17/21 163 lb (73.9 kg)  01/15/21 158 lb (71.7 kg)   Patient is in no  distress; well developed, nourished and groomed; neck is supple  EYES: Visual fields full to confrontation, Extraocular movements intacts,   MUSCULOSKELETAL: Gait, strength, tone, movements noted in Neurologic exam below  NEUROLOGIC: MENTAL STATUS:     04/22/2022   11:17 AM 04/17/2021   12:55 PM  MMSE - Mini Mental State Exam  Orientation to time 1 4  Orientation to Place 4 5  Registration 3 3  Attention/  Calculation 0 1  Recall 0 2  Language- name 2 objects 2 2  Language- repeat 1 1  Language- follow 3 step command 3 3  Language- read & follow direction 0 1  Write a sentence 0 1  Copy design 0 0  Copy design-comments  4-legged animals x 1 min: 4  Total score 14 23    CRANIAL NERVE:  2nd, 3rd, 4th, 6th - visual fields full to confrontation, extraocular muscles intact, no nystagmus 5th - facial sensation symmetric 7th - facial strength symmetric 8th - hearing intact 9th - palate elevates symmetrically, uvula midline 11th - shoulder shrug symmetric 12th - tongue protrusion midline  MOTOR:  normal bulk and tone, full strength in the BUE, BLE  SENSORY:  Slight decrease sensation to light touch, pinprick in the left mid shin to ankle area when compared to the right. Vibration and proprioception is normal  COORDINATION:  finger-nose-finger, fine finger movements normal  GAIT/STATION:  normal   DIAGNOSTIC DATA (LABS, IMAGING, TESTING) - I reviewed patient records, labs, notes, testing and imaging myself where available.  Lab Results  Component Value Date   WBC 5.7 01/15/2021   HGB 14.3 01/15/2021   HCT 42.9 01/15/2021   MCV 94 01/15/2021   PLT 166 01/15/2021      Component Value Date/Time   NA 143 01/15/2021 1349   K 4.4 01/15/2021 1349   CL 103 01/15/2021 1349   CO2 27 01/15/2021 1349   GLUCOSE 79 01/15/2021 1349   BUN 17 01/15/2021 1349   CREATININE 0.99 01/15/2021 1349   CALCIUM 9.2 01/15/2021 1349   PROT 6.6 01/15/2021 1349   ALBUMIN 4.4 01/15/2021  1349   AST 19 01/15/2021 1349   ALT 16 01/15/2021 1349   ALKPHOS 78 01/15/2021 1349   BILITOT 0.3 01/15/2021 1349   No results found for: "CHOL", "HDL", "LDLCALC", "LDLDIRECT", "TRIG", "CHOLHDL" Lab Results  Component Value Date   HGBA1C 5.6 01/15/2021   Lab Results  Component Value Date   ZOXWRUEA54 0,981 (H) 01/15/2021   Lab Results  Component Value Date   TSH 2.650 01/15/2021      ASSESSMENT AND PLAN  72 y.o. year old male with past medical history of chronic back pain, hyperlipidemia who is presenting for follow up for his neuropathy and cognitive impairment.  For his neuropathy he mentioned that pregabalin 75 mg 3 times daily helps but on occasion he will have breakthrough pain.  I did advise him to increase the pregabalin to 150 mg twice daily.   In terms of his cognitive impairment, I will obtain the ATN profile to look for Alzheimer disease biomarker and also obtain MRI brain due to the fact that he is complaining of new daily headache.  I will contact the patient to go over the results otherwise I will see him in 1 year.  In the case the ATN profile comes back positive, we will most likely start him on Exelon, he could not tolerate the Aricept.  This was explained to the patient and Colin and they are comfortable with plans. Follow up in a year    1. Mild cognitive impairment   2. Nonintractable headache, unspecified chronicity pattern, unspecified headache type   3. Neuropathy      Patient Instructions  ATN profile to look for Alzheimer disease biomarker's MRI brain without contrast I will contact you to go over the results Continue following-up with your doctors Return in 1 year or sooner if worse.   Orders Placed  This Encounter  Procedures   MR BRAIN WO CONTRAST   ATN PROFILE     No orders of the defined types were placed in this encounter.    Return in about 1 year (around 04/23/2023).  I have spent a total of 45 minutes dedicated to this patient  today, preparing to see patient, performing a medically appropriate examination and evaluation, ordering tests and/or medications and procedures, and counseling and educating the patient/family/caregiver; independently interpreting result and communicating results to the family/patient/caregiver; and documenting clinical information in the electronic medical record.   Alric Ran, MD 04/22/2022, 1:05 PM  [facility] [address] [place], [place] [address] [phone]

## 2022-04-22 NOTE — Patient Instructions (Addendum)
ATN profile to look for Alzheimer disease biomarker's MRI brain without contrast I will contact you to go over the results Increase Pregabalin to 150 mg twice daily  Continue following-up with your doctors Return in 1 year or sooner if worse.

## 2022-04-22 NOTE — Progress Notes (Addendum)
Previous note entered in error. 

## 2022-04-25 ENCOUNTER — Encounter: Payer: Self-pay | Admitting: Neurology

## 2022-04-25 LAB — ATN PROFILE
A -- Beta-amyloid 42/40 Ratio: 0.09 — ABNORMAL LOW (ref >0.102)
Beta-amyloid 40: 235.14 pg/mL
Beta-amyloid 42: 21.2 pg/mL
N -- NfL, Plasma: 4.68 pg/mL (ref 0.00–7.64)
T -- p-tau181: 2.6 pg/mL — ABNORMAL HIGH (ref 0.00–0.97)

## 2022-04-27 MED ORDER — RIVASTIGMINE TARTRATE 1.5 MG PO CAPS: 1.5000 mg | ORAL_CAPSULE | Freq: Two times a day (BID) | ORAL | 3 refills | Status: DC

## 2022-04-27 NOTE — Addendum Note (Signed)
Addended byAlric Ran on: 04/27/2022 09:40 AM   Modules accepted: Orders

## 2022-04-30 ENCOUNTER — Encounter: Payer: Self-pay | Admitting: Neurology

## 2022-05-05 ENCOUNTER — Telehealth: Payer: Self-pay | Admitting: Neurology

## 2022-05-05 ENCOUNTER — Telehealth: Payer: Self-pay | Admitting: Pharmacy Technician

## 2022-05-05 NOTE — Telephone Encounter (Signed)
[employer] medicare Colin KaufmannKS:[id number] exp. 05/05/22-11/01/2022 sent to [facility] [phone]

## 2022-05-05 NOTE — Telephone Encounter (Signed)
Patient Advocate Encounter   Received notification that prior authorization for Pregabalin 150MG capsules is required.   PA submitted on 05/05/2022 Key [id number] Status is pending       Lyndel Safe, [place] Patient Advocate Specialist [place] Patient Advocate Team Direct Number: [phone]  Fax: [phone]

## 2022-05-05 NOTE — Telephone Encounter (Signed)
Patient Advocate Encounter  Prior Authorization for Pregabalin 150MG capsules has been approved.    PA# [id number] Effective dates: 05/05/2022 through 03/23/2023      Lyndel Safe, [place] Patient Advocate Specialist [place] Patient Advocate Team Direct Number: [phone]  Fax: [phone]

## 2022-05-11 DIAGNOSIS — R519 Headache, unspecified: Secondary | ICD-10-CM | POA: Diagnosis not present

## 2022-05-11 DIAGNOSIS — G3184 Mild cognitive impairment, so stated: Secondary | ICD-10-CM | POA: Diagnosis not present

## 2022-05-18 ENCOUNTER — Encounter: Payer: Self-pay | Admitting: Neurology

## 2022-05-20 NOTE — Telephone Encounter (Signed)
Pt brought in MRI disk in for review, would like a call once reviewed, and to schedule appt sooner if needed.  [phone]

## 2022-05-25 ENCOUNTER — Encounter: Payer: Self-pay | Admitting: Neurology

## 2022-05-25 NOTE — Progress Notes (Signed)
MRI Brain 05/11/2022 No evidence of acute intracranial abnormality  Mild chronic small vessel ischemic changes within the cerebral white matter.

## 2022-05-28 ENCOUNTER — Encounter: Payer: Self-pay | Admitting: Neurology

## 2022-05-28 NOTE — Telephone Encounter (Signed)
Spoke with spouse Clarene Critchley, informed her that MRI Brain was within normal limits, no strokes, no tumor, and no bleed. It shows only mild small vessel disease.

## 2022-06-08 DIAGNOSIS — R350 Frequency of micturition: Secondary | ICD-10-CM | POA: Diagnosis not present

## 2022-06-08 DIAGNOSIS — R3915 Urgency of urination: Secondary | ICD-10-CM | POA: Diagnosis not present

## 2022-06-11 DIAGNOSIS — N401 Enlarged prostate with lower urinary tract symptoms: Secondary | ICD-10-CM | POA: Diagnosis not present

## 2022-06-11 DIAGNOSIS — N393 Stress incontinence (female) (male): Secondary | ICD-10-CM | POA: Diagnosis not present

## 2022-06-11 DIAGNOSIS — R972 Elevated prostate specific antigen [PSA]: Secondary | ICD-10-CM | POA: Diagnosis not present

## 2022-07-02 DIAGNOSIS — I951 Orthostatic hypotension: Secondary | ICD-10-CM | POA: Diagnosis not present

## 2022-07-02 DIAGNOSIS — N400 Enlarged prostate without lower urinary tract symptoms: Secondary | ICD-10-CM | POA: Diagnosis not present

## 2022-07-02 DIAGNOSIS — R519 Headache, unspecified: Secondary | ICD-10-CM | POA: Diagnosis not present

## 2022-07-02 DIAGNOSIS — Z6825 Body mass index (BMI) 25.0-25.9, adult: Secondary | ICD-10-CM | POA: Diagnosis not present

## 2022-07-03 DIAGNOSIS — Z87891 Personal history of nicotine dependence: Secondary | ICD-10-CM | POA: Diagnosis not present

## 2022-07-03 DIAGNOSIS — E785 Hyperlipidemia, unspecified: Secondary | ICD-10-CM | POA: Diagnosis not present

## 2022-07-03 DIAGNOSIS — R531 Weakness: Secondary | ICD-10-CM | POA: Diagnosis not present

## 2022-07-03 DIAGNOSIS — Z9889 Other specified postprocedural states: Secondary | ICD-10-CM | POA: Diagnosis not present

## 2022-07-03 DIAGNOSIS — F03A0 Unspecified dementia, mild, without behavioral disturbance, psychotic disturbance, mood disturbance, and anxiety: Secondary | ICD-10-CM | POA: Diagnosis not present

## 2022-07-03 DIAGNOSIS — Z87442 Personal history of urinary calculi: Secondary | ICD-10-CM | POA: Diagnosis not present

## 2022-07-03 DIAGNOSIS — G8194 Hemiplegia, unspecified affecting left nondominant side: Secondary | ICD-10-CM | POA: Diagnosis not present

## 2022-07-03 DIAGNOSIS — I6789 Other cerebrovascular disease: Secondary | ICD-10-CM | POA: Diagnosis not present

## 2022-07-03 DIAGNOSIS — R52 Pain, unspecified: Secondary | ICD-10-CM | POA: Diagnosis not present

## 2022-07-03 DIAGNOSIS — I4510 Unspecified right bundle-branch block: Secondary | ICD-10-CM | POA: Diagnosis not present

## 2022-07-03 DIAGNOSIS — Z5189 Encounter for other specified aftercare: Secondary | ICD-10-CM | POA: Diagnosis not present

## 2022-07-03 DIAGNOSIS — S065X0A Traumatic subdural hemorrhage without loss of consciousness, initial encounter: Secondary | ICD-10-CM | POA: Diagnosis not present

## 2022-07-03 DIAGNOSIS — I493 Ventricular premature depolarization: Secondary | ICD-10-CM | POA: Diagnosis not present

## 2022-07-03 DIAGNOSIS — R9431 Abnormal electrocardiogram [ECG] [EKG]: Secondary | ICD-10-CM | POA: Diagnosis not present

## 2022-07-03 DIAGNOSIS — S065XAA Traumatic subdural hemorrhage with loss of consciousness status unknown, initial encounter: Secondary | ICD-10-CM | POA: Diagnosis not present

## 2022-07-03 DIAGNOSIS — G629 Polyneuropathy, unspecified: Secondary | ICD-10-CM | POA: Diagnosis not present

## 2022-07-03 DIAGNOSIS — Z8616 Personal history of COVID-19: Secondary | ICD-10-CM | POA: Diagnosis not present

## 2022-07-03 DIAGNOSIS — W182XXA Fall in (into) shower or empty bathtub, initial encounter: Secondary | ICD-10-CM | POA: Diagnosis not present

## 2022-07-03 DIAGNOSIS — K219 Gastro-esophageal reflux disease without esophagitis: Secondary | ICD-10-CM | POA: Diagnosis not present

## 2022-07-03 DIAGNOSIS — I6200 Nontraumatic subdural hemorrhage, unspecified: Secondary | ICD-10-CM | POA: Diagnosis not present

## 2022-07-03 DIAGNOSIS — G935 Compression of brain: Secondary | ICD-10-CM | POA: Diagnosis not present

## 2022-07-03 DIAGNOSIS — Y92002 Bathroom of unspecified non-institutional (private) residence single-family (private) house as the place of occurrence of the external cause: Secondary | ICD-10-CM | POA: Diagnosis not present

## 2022-07-03 DIAGNOSIS — Z79899 Other long term (current) drug therapy: Secondary | ICD-10-CM | POA: Diagnosis not present

## 2022-07-03 DIAGNOSIS — N400 Enlarged prostate without lower urinary tract symptoms: Secondary | ICD-10-CM | POA: Diagnosis not present

## 2022-07-03 DIAGNOSIS — R69 Illness, unspecified: Secondary | ICD-10-CM | POA: Diagnosis not present

## 2022-07-03 HISTORY — DX: Traumatic subdural hemorrhage with loss of consciousness status unknown, initial encounter: S06.5XAA

## 2022-07-05 DIAGNOSIS — I4510 Unspecified right bundle-branch block: Secondary | ICD-10-CM | POA: Diagnosis not present

## 2022-07-05 DIAGNOSIS — I6789 Other cerebrovascular disease: Secondary | ICD-10-CM | POA: Diagnosis not present

## 2022-07-05 DIAGNOSIS — S065X0A Traumatic subdural hemorrhage without loss of consciousness, initial encounter: Secondary | ICD-10-CM | POA: Diagnosis not present

## 2022-07-05 DIAGNOSIS — Z5189 Encounter for other specified aftercare: Secondary | ICD-10-CM | POA: Diagnosis not present

## 2022-07-06 ENCOUNTER — Encounter: Payer: Self-pay | Admitting: Neurology

## 2022-07-06 NOTE — Telephone Encounter (Signed)
Thank you :)

## 2022-07-10 DIAGNOSIS — N401 Enlarged prostate with lower urinary tract symptoms: Secondary | ICD-10-CM | POA: Diagnosis not present

## 2022-07-10 DIAGNOSIS — N393 Stress incontinence (female) (male): Secondary | ICD-10-CM | POA: Diagnosis not present

## 2022-07-10 DIAGNOSIS — R972 Elevated prostate specific antigen [PSA]: Secondary | ICD-10-CM | POA: Diagnosis not present

## 2022-07-23 ENCOUNTER — Encounter: Payer: Self-pay | Admitting: Neurology

## 2022-07-23 ENCOUNTER — Other Ambulatory Visit: Payer: Self-pay

## 2022-07-23 MED ORDER — RIVASTIGMINE TARTRATE 1.5 MG PO CAPS: 1.5000 mg | ORAL_CAPSULE | Freq: Two times a day (BID) | ORAL | 3 refills | Status: DC

## 2022-08-06 DIAGNOSIS — S065XAA Traumatic subdural hemorrhage with loss of consciousness status unknown, initial encounter: Secondary | ICD-10-CM | POA: Diagnosis not present

## 2022-08-06 DIAGNOSIS — Z48812 Encounter for surgical aftercare following surgery on the circulatory system: Secondary | ICD-10-CM | POA: Diagnosis not present

## 2022-08-06 DIAGNOSIS — X58XXXA Exposure to other specified factors, initial encounter: Secondary | ICD-10-CM | POA: Diagnosis not present

## 2022-08-13 DIAGNOSIS — Z6825 Body mass index (BMI) 25.0-25.9, adult: Secondary | ICD-10-CM | POA: Diagnosis not present

## 2022-08-13 DIAGNOSIS — S065XAA Traumatic subdural hemorrhage with loss of consciousness status unknown, initial encounter: Secondary | ICD-10-CM | POA: Diagnosis not present

## 2022-08-13 DIAGNOSIS — R519 Headache, unspecified: Secondary | ICD-10-CM | POA: Diagnosis not present

## 2022-08-21 DIAGNOSIS — R972 Elevated prostate specific antigen [PSA]: Secondary | ICD-10-CM | POA: Diagnosis not present

## 2022-08-21 DIAGNOSIS — N401 Enlarged prostate with lower urinary tract symptoms: Secondary | ICD-10-CM | POA: Diagnosis not present

## 2022-08-21 DIAGNOSIS — N393 Stress incontinence (female) (male): Secondary | ICD-10-CM | POA: Diagnosis not present

## 2022-09-18 DIAGNOSIS — H61303 Acquired stenosis of external ear canal, unspecified, bilateral: Secondary | ICD-10-CM | POA: Diagnosis not present

## 2022-09-18 DIAGNOSIS — H6123 Impacted cerumen, bilateral: Secondary | ICD-10-CM | POA: Diagnosis not present

## 2022-11-03 DIAGNOSIS — N401 Enlarged prostate with lower urinary tract symptoms: Secondary | ICD-10-CM | POA: Diagnosis not present

## 2022-11-03 DIAGNOSIS — N393 Stress incontinence (female) (male): Secondary | ICD-10-CM | POA: Diagnosis not present

## 2022-11-03 DIAGNOSIS — R972 Elevated prostate specific antigen [PSA]: Secondary | ICD-10-CM | POA: Diagnosis not present

## 2022-11-06 ENCOUNTER — Other Ambulatory Visit: Payer: Self-pay | Admitting: Neurology

## 2022-11-09 NOTE — Telephone Encounter (Signed)
Requested Prescriptions   Pending Prescriptions Disp Refills   pregabalin (LYRICA) 150 MG capsule [Pharmacy Med Name: Pregabalin 150 MG Oral Capsule] 180 capsule 0    Sig: Take 1 capsule by mouth twice daily   Last seen 04/22/22, next appt 04/26/23 Dispenses   Dispensed Days Supply Quantity Provider Pharmacy  PREGABALIN 150MG  CAP 08/15/2022 90 180 each Windell Norfolk, MD [facility] [phone] ...  PREGABALIN 150MG  CAP 05/19/2022 90 180 each Windell Norfolk, MD [facility] [phone] ...  PREGABALIN 75MG  CAP 03/11/2022 90 270 each Windell Norfolk, MD [facility] [phone] .Marland KitchenMarland Kitchen

## 2022-11-10 ENCOUNTER — Other Ambulatory Visit: Payer: Self-pay | Admitting: Neurology

## 2022-11-11 DIAGNOSIS — L820 Inflamed seborrheic keratosis: Secondary | ICD-10-CM | POA: Diagnosis not present

## 2022-12-28 ENCOUNTER — Other Ambulatory Visit: Payer: Self-pay | Admitting: Neurology

## 2022-12-29 NOTE — Telephone Encounter (Signed)
Last visit: 04/22/22 Next visit: 04/26/23  Per adherence report on epic, last filled:  Rx refill sent to Dr Teresa Coombs

## 2023-01-07 DIAGNOSIS — Z961 Presence of intraocular lens: Secondary | ICD-10-CM | POA: Diagnosis not present

## 2023-01-19 DIAGNOSIS — Z23 Encounter for immunization: Secondary | ICD-10-CM | POA: Diagnosis not present

## 2023-03-29 DIAGNOSIS — J069 Acute upper respiratory infection, unspecified: Secondary | ICD-10-CM | POA: Diagnosis not present

## 2023-04-26 ENCOUNTER — Encounter: Payer: Self-pay | Admitting: Neurology

## 2023-04-26 ENCOUNTER — Ambulatory Visit: Payer: Medicare HMO | Admitting: Neurology

## 2023-04-26 VITALS — BP 119/66 | HR 59 | Ht 68.0 in | Wt 162.5 lb

## 2023-04-26 DIAGNOSIS — F02A0 Dementia in other diseases classified elsewhere, mild, without behavioral disturbance, psychotic disturbance, mood disturbance, and anxiety: Secondary | ICD-10-CM | POA: Diagnosis not present

## 2023-04-26 DIAGNOSIS — G629 Polyneuropathy, unspecified: Secondary | ICD-10-CM | POA: Diagnosis not present

## 2023-04-26 DIAGNOSIS — G301 Alzheimer's disease with late onset: Secondary | ICD-10-CM

## 2023-04-26 MED ORDER — PREGABALIN 150 MG PO CAPS: 150.0000 mg | ORAL_CAPSULE | Freq: Two times a day (BID) | ORAL | 2 refills | Status: DC

## 2023-04-26 MED ORDER — RIVASTIGMINE TARTRATE 1.5 MG PO CAPS: 1.5000 mg | ORAL_CAPSULE | Freq: Two times a day (BID) | ORAL | 3 refills | Status: DC

## 2023-04-26 NOTE — Patient Instructions (Signed)
Continue with Exelon 1.5 mg twice daily Continue with pregabalin 150 mg twice daily Continue your other medications Continue to follow with PCP Return in 1 year or sooner if worse.  There are well-accepted and sensible ways to reduce risk for Alzheimers disease and other degenerative brain disorders .  Exercise Daily Walk A daily 20 minute walk should be part of your routine. Disease related apathy can be a significant roadblock to exercise and the only way to overcome this is to make it a daily routine and perhaps have a reward at the end (something your loved one loves to eat or drink perhaps) or a personal trainer coming to the home can also be very useful. Most importantly, the patient is much more likely to exercise if the caregiver / spouse does it with him/her. In general a structured, repetitive schedule is best.  General Health: Any diseases which effect your body will effect your brain such as a pneumonia, urinary infection, blood clot, heart attack or stroke. Keep contact with your primary care doctor for regular follow ups.  Sleep. A good nights sleep is healthy for the brain. Seven hours is recommended. If you have insomnia or poor sleep habits we can give you some instructions. If you have sleep apnea wear your mask.  Diet: Eating a heart healthy diet is also a good idea; fish and poultry instead of red meat, nuts (mostly non-peanuts), vegetables, fruits, olive oil or canola oil (instead of butter), minimal salt (use other spices to flavor foods), whole grain rice, bread, cereal and pasta and wine in moderation.Research is now showing that the MIND diet, which is a combination of The Mediterranean diet and the DASH diet, is beneficial for cognitive processing and longevity. Information about this diet can be found in The MIND Diet, a book by Alonna Minium, MS, RDN, and online at [employer]  Finances, Power of [address] and Advance Directives: You should  consider putting legal safeguards in place with regard to financial and medical decision making. While the spouse always has power of attorney for medical and financial issues in the absence of any form, you should consider what you want in case the spouse / caregiver is no longer around or capable of making decisions.

## 2023-04-26 NOTE — Progress Notes (Signed)
[facility]  PATIENT: Colin Edwards DOB: Mar 02, 1951  REFERRING CLINICIAN: Noni Saupe, MD HISTORY FROM: Patient and spouse  REASON FOR VISIT: Dementia and Neuropathy follow up   HISTORICAL  CHIEF COMPLAINT:  Chief Complaint  Patient presents with   Memory Loss    RM12, MEMORY LOSS: MMSE SCORE 15   INTERVAL HISTORY 04/26/2023:  Patient presents today for follow-up, he is accompanied by wife.  Last visit was in January.  At that time we updated his ATN profile which was positive for Alzheimer disease biomarker.  He could not tolerate Aricept, therefore we started him on rivastigmine, 1.5 mg twice daily.  He has been tolerating the medication very well.  Overall he is stable per wife.  In terms of his neuropathy, they tell me that the pregabalin is helpful.  No additional concerns  INTERVAL HISTORY 04/22/2022:  Patient presents today for follow-up, he is accompanied by wife Rosey Bath.  At last visit we have started him on Aricept for his memory problem.  He did start it for few days but have GI side effects therefore he self discontinued the medication.  He still on Prevagen, stated that the memory is stable.  He still lives with his wife, still able to drive to familiar places, no recent accident and has not been lost.  He does help around but wife do most of the activities of daily living.  He remains forgetful and also have word finding difficulties. Today he is complaining of daily headaches, wakes up the headaches. Headaches usually starts in the back of the head and radiates to the front.    INTERVAL HISTORY 04/16/2021 Patient presents today for follow-up with his wife [name], he said that neuropathy is better with the pregabalin 75 mg twice daily.  Still have occasional burning pain.  He is neuropathy labs were done and did not show any abnormalities.  He is also complaining of memory problem.  He think that he forgets very easily, he is easily distracted, he  reports he will go outside to pick up something but will forget the reason he went outside in the first place.  Sometimes while driving to familiar places he will get confused but has not reported being lost and having to call his wife or using the GPS to go back home.  Denies any recent accident.  Per wife patient is easily distracted, he does not have issue with recent conversation. A year ago he was noted to be repeating himself numerous times, saying the same story over and over but since starting the Prevagen he has not been doing that anymore.  He still independent, remains active, wife is handling the finances.     HISTORY OF PRESENT ILLNESS:  This is a 73 year old gentleman with past medical history of back pain, hyperlipidemia, GERD who is presenting with complaint of burning sensation in his leg mostly the left leg from the mid shin to the ankle.  Patient stated in March 2020 he had back surgery due to severe chronic back pain, he had plate and screws placed he mentioned that the back pain did not resolve so 2 years later in March 2022 he had another second surgery done and after the surgery he noted the burning in the legs.  He described the pain as "someone sticking a knife in his left leg".  Symptoms are getting worse.  Burning sensation is constant.  Nothing seems to improve the pain.  He is on gabapentin 300 mg 3  times daily but no improvement.  He denies any weakness of the legs and denies any falls.     OTHER MEDICAL CONDITIONS: Chronic back pain, GERD, HLD   REVIEW OF SYSTEMS: Full 14 system review of systems performed and negative with exception of: as noted in the HPI  ALLERGIES: Allergies  Allergen Reactions   Augmentin [Amoxicillin-Pot Clavulanate]    Ciprofloxacin Nausea And Vomiting    Required IV fluid   Hydrocodone    Levaquin [Levofloxacin]    Morphine Nausea And Vomiting   Morphine And Codeine    Sulfa Antibiotics    Tamsulosin    Tramadol     HOME  MEDICATIONS: Outpatient Medications Prior to Visit  Medication Sig Dispense Refill   ascorbic acid (VITAMIN C) 500 MG tablet Take by mouth.     famotidine (PEPCID) 40 MG tablet Take 40 mg by mouth at bedtime.     FIBER PO Take 2 tablets by mouth daily.     finasteride (PROSCAR) 5 MG tablet Take 5 mg by mouth daily.     MELATONIN PO Take 10 mg by mouth at bedtime.     pravastatin (PRAVACHOL) 20 MG tablet Take by mouth.     tamsulosin (FLOMAX) 0.4 MG CAPS capsule Take 0.4 mg by mouth at bedtime.     vitamin B-12 (CYANOCOBALAMIN) 500 MCG tablet Take by mouth.     pregabalin (LYRICA) 150 MG capsule Take 1 capsule by mouth twice daily 180 capsule 2   rivastigmine (EXELON) 1.5 MG capsule Take 1 capsule (1.5 mg total) by mouth 2 (two) times daily. 180 capsule 3   Apoaequorin (PREVAGEN PO) Take 1 tablet by mouth daily.     omeprazole (PRILOSEC) 20 MG capsule Take by mouth.     No facility-administered medications prior to visit.    PAST MEDICAL HISTORY: Past Medical History:  Diagnosis Date   Chronic back pain    Chronic GERD     PAST SURGICAL HISTORY: Past Surgical History:  Procedure Laterality Date   SPINE SURGERY      FAMILY HISTORY: Family History  Problem Relation Age of Onset   Dementia Father     SOCIAL HISTORY: Social History   Socioeconomic History   Marital status: Married    Spouse name: Not on file   Number of children: Not on file   Years of education: Not on file   Highest education level: Not on file  Occupational History   Not on file  Tobacco Use   Smoking status: Never   Smokeless tobacco: Never  Substance and Sexual Activity   Alcohol use: Yes    Comment: rare   Drug use: Not on file   Sexual activity: Not on file  Other Topics Concern   Not on file  Social History Narrative   Retired    Chief Executive Officer Drivers of Corporate investment banker Strain: Not on Ship broker Insecurity: Not on file  Transportation Needs: Not on file  Physical Activity:  Not on file  Stress: Not on file  Social Connections: Not on file  Intimate Partner Violence: Not on file    PHYSICAL EXAM  GENERAL EXAM/CONSTITUTIONAL: Vitals:  Vitals:   04/26/23 1141  BP: 119/66  Pulse: (!) 59  Weight: 162 lb 8 oz (73.7 kg)  Height: 5\' 8"  (1.727 m)    Body mass index is 24.71 kg/m. Wt Readings from Last 3 Encounters:  04/26/23 162 lb 8 oz (73.7 kg)  04/22/22 163 lb (73.9 kg)  04/17/21 163 lb (73.9 kg)   Patient is in no distress; well developed, nourished and groomed; neck is supple  MUSCULOSKELETAL: Gait, strength, tone, movements noted in Neurologic exam below  NEUROLOGIC: MENTAL STATUS:     04/26/2023   11:42 AM 04/22/2022   11:17 AM 04/17/2021   12:55 PM  MMSE - Mini Mental State Exam  Orientation to time 2 1 4   Orientation to Place 4 4 5   Registration 3 3 3   Attention/ Calculation 0 0 1  Recall 0 0 2  Language- name 2 objects 2 2 2   Language- repeat 0 1 1  Language- follow 3 step command 3 3 3   Language- read & follow direction 1 0 1  Write a sentence 0 0 1  Copy design 0 0 0  Copy design-comments   4-legged animals x 1 min: 4  Total score 15 14 23     CRANIAL NERVE:  2nd, 3rd, 4th, 6th - visual fields full to confrontation, extraocular muscles intact, no nystagmus 5th - facial sensation symmetric 7th - facial strength symmetric 8th - hearing intact 9th - palate elevates symmetrically, uvula midline 11th - shoulder shrug symmetric 12th - tongue protrusion midline  MOTOR:  normal bulk and tone, full strength in the BUE, BLE  SENSORY:  Slight decrease sensation to light touch, pinprick in the left mid shin to ankle area when compared to the right. Vibration and proprioception is normal  COORDINATION:  finger-nose-finger, fine finger movements normal  GAIT/STATION:  normal   DIAGNOSTIC DATA (LABS, IMAGING, TESTING) - I reviewed patient records, labs, notes, testing and imaging myself where available.  Lab Results   Component Value Date   WBC 5.7 01/15/2021   HGB 14.3 01/15/2021   HCT 42.9 01/15/2021   MCV 94 01/15/2021   PLT 166 01/15/2021      Component Value Date/Time   NA 143 01/15/2021 1349   K 4.4 01/15/2021 1349   CL 103 01/15/2021 1349   CO2 27 01/15/2021 1349   GLUCOSE 79 01/15/2021 1349   BUN 17 01/15/2021 1349   CREATININE 0.99 01/15/2021 1349   CALCIUM 9.2 01/15/2021 1349   PROT 6.6 01/15/2021 1349   ALBUMIN 4.4 01/15/2021 1349   AST 19 01/15/2021 1349   ALT 16 01/15/2021 1349   ALKPHOS 78 01/15/2021 1349   BILITOT 0.3 01/15/2021 1349   No results found for: "CHOL", "HDL", "LDLCALC", "LDLDIRECT", "TRIG", "CHOLHDL" Lab Results  Component Value Date   HGBA1C 5.6 01/15/2021   Lab Results  Component Value Date   VITAMINB12 1,689 (H) 01/15/2021   Lab Results  Component Value Date   TSH 2.650 01/15/2021   ATN Profile consistent with Alzheimer disease pathology.   MRI Brain 05/11/2022 No evidence of acute intracranial abnormality  Mild chronic small vessel ischemic changes within the cerebral white matter.   ASSESSMENT AND PLAN  73 y.o. year old male with past medical history of chronic back pain, hyperlipidemia who is presenting for follow up for his neuropathy and mild dementia.  For his neuropathy he mentioned that pregabalin has been helpful, we will continue him on the pregabalin to 150 mg twice daily.   In terms of his cognitive impairment, his ATN profile was positive for Alzheimer disease biomarker.  He has been tolerating Exelon 1.5 mg without side effect.  His heart rate today is 41, wife tells me that is normal for him but due to low heart rate, we will not increase his Exelon because he can close bradycardia.  MoCA score 15 showing evidence of impairment.  Plan will be for patient to continue his Exelon 1.5 mg twice daily and to contact us if his symptoms do get worse.  Will continue to see him on a yearly basis.  Return sooner if worse.    1. Mild late onset  Alzheimer's dementia without behavioral disturbance, psychotic disturbance, mood disturbance, or anxiety (HCC)   2. Neuropathy     Patient Instructions  Continue with Exelon 1.5 mg twice daily Continue with pregabalin 150 mg twice daily Continue your other medications Continue to follow with PCP Return in 1 year or sooner if worse.  There are well-accepted and sensible ways to reduce risk for Alzheimers disease and other degenerative brain disorders .  Exercise Daily Walk A daily 20 minute walk should be part of your routine. Disease related apathy can be a significant roadblock to exercise and the only way to overcome this is to make it a daily routine and perhaps have a reward at the end (something your loved one loves to eat or drink perhaps) or a personal trainer coming to the home can also be very useful. Most importantly, the patient is much more likely to exercise if the caregiver / spouse does it with him/her. In general a structured, repetitive schedule is best.  General Health: Any diseases which effect your body will effect your brain such as a pneumonia, urinary infection, blood clot, heart attack or stroke. Keep contact with your primary care doctor for regular follow ups.  Sleep. A good nights sleep is healthy for the brain. Seven hours is recommended. If you have insomnia or poor sleep habits we can give you some instructions. If you have sleep apnea wear your mask.  Diet: Eating a heart healthy diet is also a good idea; fish and poultry instead of red meat, nuts (mostly non-peanuts), vegetables, fruits, olive oil or canola oil (instead of butter), minimal salt (use other spices to flavor foods), whole grain rice, bread, cereal and pasta and wine in moderation.Research is now showing that the MIND diet, which is a combination of The Mediterranean diet and the DASH diet, is beneficial for cognitive processing and longevity. Information about this diet can be found in The MIND Diet, a  book by Alonna Minium, MS, RDN, and online at [employer]Wild[employer].es  Finances, Power of [address] and Advance Directives: You should consider putting legal safeguards in place with regard to financial and medical decision making. While the spouse always has power of attorney for medical and financial issues in the absence of any form, you should consider what you want in case the spouse / caregiver is no longer around or capable of making decisions.      No orders of the defined types were placed in this encounter.    Meds ordered this encounter  Medications   rivastigmine (EXELON) 1.5 MG capsule    Sig: Take 1 capsule (1.5 mg total) by mouth 2 (two) times daily.    Dispense:  180 capsule    Refill:  3   pregabalin (LYRICA) 150 MG capsule    Sig: Take 1 capsule (150 mg total) by mouth 2 (two) times daily.    Dispense:  180 capsule    Refill:  2     Return in about 1 year (around 04/25/2024).    Windell Norfolk, MD 04/26/2023, 6:49 PM  [facility] [address] [place], [place] [address] [phone]

## 2023-06-10 DIAGNOSIS — M1711 Unilateral primary osteoarthritis, right knee: Secondary | ICD-10-CM | POA: Diagnosis not present

## 2023-06-10 DIAGNOSIS — R196 Halitosis: Secondary | ICD-10-CM | POA: Diagnosis not present

## 2023-06-10 DIAGNOSIS — Z6825 Body mass index (BMI) 25.0-25.9, adult: Secondary | ICD-10-CM | POA: Diagnosis not present

## 2023-07-13 DIAGNOSIS — S81811A Laceration without foreign body, right lower leg, initial encounter: Secondary | ICD-10-CM | POA: Diagnosis not present

## 2023-07-13 DIAGNOSIS — M25511 Pain in right shoulder: Secondary | ICD-10-CM | POA: Diagnosis not present

## 2023-08-02 DIAGNOSIS — I440 Atrioventricular block, first degree: Secondary | ICD-10-CM | POA: Diagnosis not present

## 2023-08-02 DIAGNOSIS — E559 Vitamin D deficiency, unspecified: Secondary | ICD-10-CM | POA: Diagnosis not present

## 2023-08-02 DIAGNOSIS — I4510 Unspecified right bundle-branch block: Secondary | ICD-10-CM | POA: Diagnosis not present

## 2023-08-02 DIAGNOSIS — R001 Bradycardia, unspecified: Secondary | ICD-10-CM | POA: Diagnosis not present

## 2023-08-06 DIAGNOSIS — R001 Bradycardia, unspecified: Secondary | ICD-10-CM | POA: Diagnosis not present

## 2023-08-09 ENCOUNTER — Encounter: Payer: Self-pay | Admitting: Cardiology

## 2023-08-09 ENCOUNTER — Encounter: Payer: Self-pay | Admitting: *Deleted

## 2023-08-10 ENCOUNTER — Encounter: Payer: Self-pay | Admitting: Cardiology

## 2023-08-12 DIAGNOSIS — E7800 Pure hypercholesterolemia, unspecified: Secondary | ICD-10-CM | POA: Insufficient documentation

## 2023-08-12 DIAGNOSIS — E559 Vitamin D deficiency, unspecified: Secondary | ICD-10-CM | POA: Insufficient documentation

## 2023-08-12 DIAGNOSIS — E669 Obesity, unspecified: Secondary | ICD-10-CM | POA: Insufficient documentation

## 2023-08-12 DIAGNOSIS — K219 Gastro-esophageal reflux disease without esophagitis: Secondary | ICD-10-CM | POA: Insufficient documentation

## 2023-08-12 DIAGNOSIS — G8929 Other chronic pain: Secondary | ICD-10-CM | POA: Insufficient documentation

## 2023-08-12 DIAGNOSIS — R001 Bradycardia, unspecified: Secondary | ICD-10-CM | POA: Insufficient documentation

## 2023-08-12 DIAGNOSIS — K589 Irritable bowel syndrome without diarrhea: Secondary | ICD-10-CM | POA: Insufficient documentation

## 2023-08-12 DIAGNOSIS — R519 Headache, unspecified: Secondary | ICD-10-CM | POA: Insufficient documentation

## 2023-08-12 DIAGNOSIS — G3184 Mild cognitive impairment, so stated: Secondary | ICD-10-CM | POA: Insufficient documentation

## 2023-08-12 DIAGNOSIS — G629 Polyneuropathy, unspecified: Secondary | ICD-10-CM | POA: Insufficient documentation

## 2023-08-18 ENCOUNTER — Ambulatory Visit: Attending: Cardiology

## 2023-08-18 ENCOUNTER — Encounter: Payer: Self-pay | Admitting: Cardiology

## 2023-08-18 ENCOUNTER — Ambulatory Visit: Payer: Self-pay | Attending: Cardiology | Admitting: Cardiology

## 2023-08-18 VITALS — BP 110/64 | HR 58 | Ht 68.0 in | Wt 161.0 lb

## 2023-08-18 DIAGNOSIS — K219 Gastro-esophageal reflux disease without esophagitis: Secondary | ICD-10-CM | POA: Diagnosis not present

## 2023-08-18 DIAGNOSIS — E7800 Pure hypercholesterolemia, unspecified: Secondary | ICD-10-CM | POA: Diagnosis not present

## 2023-08-18 DIAGNOSIS — G3184 Mild cognitive impairment, so stated: Secondary | ICD-10-CM | POA: Diagnosis not present

## 2023-08-18 DIAGNOSIS — R001 Bradycardia, unspecified: Secondary | ICD-10-CM

## 2023-08-18 NOTE — Patient Instructions (Signed)
 Medication Instructions:  Your physician recommends that you continue on your current medications as directed. Please refer to the Current Medication list given to you today.  *If you need a refill on your cardiac medications before your next appointment, please call your pharmacy*  Lab Work: None If you have labs (blood work) drawn today and your tests are completely normal, you will receive your results only by: MyChart Message (if you have MyChart) OR A paper copy in the mail If you have any lab test that is abnormal or we need to change your treatment, we will call you to review the results.  Testing/Procedures: A zio monitor was ordered today. It will remain on for 14 days. You will then return monitor and event diary in provided box. It takes 1-2 weeks for report to be downloaded and returned to us . We will call you with the results. If monitor falls off or has orange flashing light, please call Zio for further instructions.    Follow-Up: At [facility], you and your health needs are our priority.  As part of our continuing mission to provide you with exceptional heart care, our providers are all part of one team.  This team includes your primary Cardiologist (physician) and Advanced Practice Providers or APPs (Physician Assistants and Nurse Practitioners) who all work together to provide you with the care you need, when you need it.  Your next appointment:   2 month(s)  Provider:   Ralene Burger, MD    We recommend signing up for the patient portal called "MyChart".  Sign up information is provided on this After Visit Summary.  MyChart is used to connect with patients for Virtual Visits (Telemedicine).  Patients are able to view lab/test results, encounter notes, upcoming appointments, etc.  Non-urgent messages can be sent to your provider as well.   To learn more about what you can do with MyChart, go to [email or web address].   Other Instructions None

## 2023-08-18 NOTE — Progress Notes (Unsigned)
 Cardiology Consultation:    Date:  08/18/2023   ID:  [name], DOB [birth year], MRN [id number]  PCP:  Russell Court, DO  Cardiologist:  Ralene Burger, MD   Referring MD: Russell Court, DO   No chief complaint on file.   History of Present Illness:    Colin Edwards is a 73 y.o. male who is being seen today for the evaluation of bradycardia at the request of Russell Court, DO.  Past medical history significant for mild dementia, year ago he fell down and ended up having subdural hematoma that required evacuation, presentation was weakness on the right side of his body.  He was referred to us  because his wife when she checked his blood pressure he cc blood pressure being low and also heart rate being low usually 50s sometimes even upper 40s.  He said he is doing fine he does not have as much energy as he used to.  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  Denies having any chest pain tightness squeezing pressure burning chest he does have some shortness of breath while walking.  Does have neuropathy lower extremities with pain in his legs but otherwise doing fine used to smoke cigar but now he does not.  He chewed tobacco before but does not anymore.  Does have family history of coronary artery disease but not premature.  Past Medical History:  Diagnosis Date   Bradycardia    Chronic back pain    Chronic GERD    Chronic neuropathic pain    Follow[facility] by [facility]   Elevated cholesterol    Headache disorder    Irritable bowel    Lumbar degenerative disc disease 06/13/2018   Mild cognitive impairment    Obesity    Peripheral neuropathy    Traumatic subdural hemorrhage  with loss of consciousness status unknown, initial encounter (HCC) 07/03/2022   Vitamin D deficiency     Past Surgical History:  Procedure Laterality Date   APPENDECTOMY     CATARACT EXTRACTION Left 12/04/2014   CHOLECYSTECTOMY     INGUINAL HERNIA REPAIR     OCCIPITAL NERVE STIMULATOR INSERTION  2021   [facility]. [facility]   SPINE SURGERY  2020   Dr. [facility]    Current Medications: Current Meds  Medication Sig   cetirizine (ZYRTEC) 10 MG tablet Take 10 mg by mouth daily.   Cholecalciferol (VITAMIN D3) 50 MCG (2000 UT) capsule Take 2,000 Units by mouth daily.   Cyanocobalamin (VITAMIN B-12 PO) Take 2,500 mcg by mouth daily.   dicyclomine (BENTYL) 20 MG tablet Take 20 mg by mouth as needed for spasms.   famotidine (PEPCID) 40 MG tablet Take 40 mg by mouth at bedtime.   FIBER PO Take 2 tablets by mouth daily.   finasteride (PROSCAR) 5 MG tablet Take 5 mg by mouth daily.   MELATONIN PO Take 10 mg by mouth at bedtime.   pravastatin (PRAVACHOL) 20 MG tablet Take 20 mg by mouth daily.   pregabalin  (LYRICA ) 150 MG capsule Take 1 capsule (150 mg total) by mouth 2 (two) times daily.   Probiotic Product (ALIGN PO) Take 1 tablet by mouth daily.  rivastigmine  (EXELON ) 1.5 MG capsule Take 1 capsule (1.5 mg total) by mouth 2 (two) times daily.     Allergies:   Augmentin [amoxicillin-pot clavulanate], Ciprofloxacin, Donepezil , Hydrocodone, Levaquin [levofloxacin], Morphine , Morphine  and codeine, Prednisone, Sulfa antibiotics, Tamsulosin, and Tramadol   Social History   Socioeconomic History   Marital status: Married    Spouse name: Not on file   Number of children: Not on file   Years of education: Not on file   Highest education level: Not on file  Occupational History   Not on file  Tobacco Use   Smoking status: Never   Smokeless tobacco: Never  Substance and Sexual Activity   Alcohol use: Yes    Comment: rare   Drug use: Not on file   Sexual activity: Not on file  Other  Topics Concern   Not on file  Social History Narrative   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 ROS:   Please see the history of present illness.    All 14 point review of systems negative except as described per history of present illness.  EKGs/Labs/Other Studies Reviewed:    The following studies were reviewed today:   EKG:  EKG Interpretation Date/Time:  Wednesday Aug 18 2023 09:49:46 EDT Ventricular Rate:  58 PR Interval:  216 QRS Duration:  142 QT Interval:  448 QTC Calculation: 439 R Axis:   164  Text Interpretation: Sinus bradycardia with 1st degree A-V block Right bundle branch block Abnormal ECG No previous ECGs available Confirmed by Ralene Burger [phone]) on 08/18/2023 9:55:48 AM    Recent Labs: No results found for requested labs within last 365 days.  Recent Lipid Panel No results found for: "CHOL", "TRIG", "HDL", "CHOLHDL", "VLDL", "LDLCALC", "LDLDIRECT"  Physical Exam:    VS:  BP 110/64   Pulse (!) 58   Ht 5\' 8"  (1.727 m)   Wt 161 lb (73 kg)   SpO2 97%   BMI 24.48 kg/m     Wt Readings from Last 3 Encounters:  08/18/23 161 lb (73 kg)  08/02/23 159 lb (72.1 kg)  04/26/23 162 lb 8 oz (73.7 kg)     GEN:  Well nourished, well developed in no acute distress HEENT: Normal NECK: No JVD; No carotid bruits LYMPHATICS: No lymphadenopathy CARDIAC: RRR, no murmurs, no rubs, no gallops RESPIRATORY:  Clear to auscultation without rales, wheezing or rhonchi  ABDOMEN: Soft, non-tender, non-distended MUSCULOSKELETAL:  No edema; No deformity  SKIN: Warm and dry NEUROLOGIC:   Alert and oriented x 3 PSYCHIATRIC:  Normal affect   ASSESSMENT:    1. Bradycardia   2. Mild cognitive impairment   3. Elevated cholesterol   4. Chronic GERD    PLAN:    In order of problems listed above:  Bradycardia.  No dizziness no syncope and decreased ability to exercise is noticeable but difficult to tell if it is related to bradycardia.  He does have conduction issues on the EKG right bundle branch block left posterior hemiblock.  Will ask him to wear Zio patch for 2 weeks to see if he get  any significant bradycardia.  There are some plans about increasing his dementia medication which can make bradycardia worse that is for interrogation and investigation is very critical.  He did have echocardiogram done in the hospital last week we will try to pull out the results and see what it show. Dyslipidemia.  He is taking pravastatin.  I do not have any recent fasting lipid profile cholesterol in [facility] from October 2023 with total cholesterol 140 HDL 49.  Will continue present management for now. Dementia.  Noted   Medication Adjustments/Labs and Tests Ordered: Current medicines are reviewed at length with the patient today.  Concerns regarding medicines are outlined above.  Orders Placed This Encounter  Procedures   EKG 12-Lead   No orders of the defined types were placed in this encounter.   Signed, Manfred Seed, MD, [facility] 08/18/2023 10:14 AM    [facility]

## 2023-09-06 DIAGNOSIS — R001 Bradycardia, unspecified: Secondary | ICD-10-CM | POA: Diagnosis not present

## 2023-09-20 DIAGNOSIS — R001 Bradycardia, unspecified: Secondary | ICD-10-CM

## 2023-09-20 DIAGNOSIS — G3184 Mild cognitive impairment, so stated: Secondary | ICD-10-CM | POA: Diagnosis not present

## 2023-09-20 DIAGNOSIS — K219 Gastro-esophageal reflux disease without esophagitis: Secondary | ICD-10-CM | POA: Diagnosis not present

## 2023-09-20 DIAGNOSIS — E7800 Pure hypercholesterolemia, unspecified: Secondary | ICD-10-CM

## 2023-09-23 ENCOUNTER — Ambulatory Visit: Payer: Self-pay | Admitting: Cardiology

## 2023-11-08 ENCOUNTER — Encounter: Payer: Self-pay | Admitting: Cardiology

## 2023-11-08 ENCOUNTER — Ambulatory Visit: Attending: Cardiology | Admitting: Cardiology

## 2023-11-08 VITALS — BP 100/60 | HR 57 | Ht 68.0 in | Wt 158.0 lb

## 2023-11-08 DIAGNOSIS — S065XAA Traumatic subdural hemorrhage with loss of consciousness status unknown, initial encounter: Secondary | ICD-10-CM | POA: Diagnosis not present

## 2023-11-08 DIAGNOSIS — R001 Bradycardia, unspecified: Secondary | ICD-10-CM | POA: Diagnosis not present

## 2023-11-08 DIAGNOSIS — E7800 Pure hypercholesterolemia, unspecified: Secondary | ICD-10-CM | POA: Diagnosis not present

## 2023-11-08 NOTE — Progress Notes (Unsigned)
 Cardiology Office Note:    Date:  11/08/2023   ID:  Court Bars, DOB 04/18/1950, MRN [id number]  PCP:  Conley Dene BROCKS, DO  Cardiologist:  Lamar Fitch, MD    Referring MD: Conley Dene BROCKS, DO   Chief Complaint  Patient presents with   Follow-up    History of Present Illness:    Colin Edwards is a 73 y.o. male past medical history significant for bradycardia, not critical, he does have mild dementia, years ago he fell down in the putting subdural hematoma that required surgical intervention, he was referred to me because sometimes his blood pressure being checked and heart rate is 40-50.  He did wear a monitor does have low slow heart rate but back in sinus, no worsening of interventricular conduction delay.  He does have baseline interventricular conduction delay.  Comes today with his wife for follow-up overall doing well.  He described the fact that he gets dizzy especially when he works a lot outside in a hot environment.  Past Medical History:  Diagnosis Date   Bradycardia    Chronic back pain    Chronic GERD    Chronic neuropathic pain    Followed by [facility]   Elevated cholesterol    Headache disorder    Irritable bowel    Lumbar degenerative disc disease 06/13/2018   Mild cognitive impairment    Obesity    Peripheral neuropathy    Traumatic subdural hemorrhage with loss of consciousness status unknown, initial encounter (HCC) 07/03/2022   Vitamin D deficiency     Past Surgical History:  Procedure Laterality Date   APPENDECTOMY     CATARACT EXTRACTION Left 12/04/2014   CHOLECYSTECTOMY     INGUINAL HERNIA REPAIR     OCCIPITAL NERVE STIMULATOR INSERTION  2021   Dr[facility]   SPINE SURGERY  2020   Dr. [facility]    Current Medications: Current Meds  Medication Sig   cetirizine (ZYRTEC) 10 MG tablet Take 10 mg by mouth daily.   Cholecalciferol (VITAMIN D3) 50 MCG (2000 UT) capsule Take 2,000 Units by mouth daily.   Cyanocobalamin  (VITAMIN B-12 PO) Take 2,500 mcg by mouth daily.   dicyclomine (BENTYL) 20 MG tablet Take 20 mg by mouth as needed for spasms.   famotidine (PEPCID) 40 MG tablet Take 40 mg by mouth at bedtime.   FIBER PO Take 2 tablets by mouth daily.   finasteride (PROSCAR) 5 MG tablet Take 5 mg by mouth daily.   MELATONIN PO Take 10 mg by mouth at bedtime.   pravastatin (PRAVACHOL) 20 MG tablet Take 20 mg by mouth daily.   pregabalin  (LYRICA ) 150 MG capsule Take 1 capsule (150 mg total) by mouth 2 (two) times daily.   Probiotic Product (ALIGN PO) Take 1 tablet by mouth daily.   rivastigmine  (EXELON ) 1.5 MG capsule Take 1 capsule (1.5 mg total) by mouth 2 (two) times daily.     Allergies:   Augmentin [amoxicillin-pot clavulanate], Ciprofloxacin, Donepezil , Hydrocodone, Levaquin [levofloxacin], Morphine , Morphine  and codeine, Prednisone, Sulfa antibiotics, Tamsulosin, and Tramadol   Social History   Socioeconomic History   Marital status: Married    Spouse name: Not on file   Number of children: Not on file   Years of education: Not on file   Highest education level: Not on file  Occupational History   Not on file  Tobacco Use   Smoking status: Never   Smokeless tobacco: Never  Substance and Sexual Activity  Alcohol use: Yes    Comment: rare   Drug use: Not on file   Sexual activity: Not on file  Other Topics Concern   Not on file  Social History Narrative   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 ROS:   Please see the history of present illness.    All 14 point review of systems negative except as described  per history of present illness  EKGs/Labs/Other Studies Reviewed:         Recent Labs: No results found for requested labs within last 365 days.  Recent Lipid Panel No results found for: CHOL, TRIG, HDL, CHOLHDL, VLDL, LDLCALC, LDLDIRECT  Physical Exam:    VS:  BP 100/60   Pulse (!) 57   Ht 5' 8 (1.727 m)   Wt 158 lb (71.7 kg)   SpO2 94%   BMI 24.02 kg/m     Wt Readings from Last 3 Encounters:  11/08/23 158 lb (71.7 kg)  08/18/23 161 lb (73 kg)  08/02/23 159 lb (72.1 kg)     GEN:  Well nourished, well developed in no acute distress HEENT: Normal NECK: No JVD; No carotid bruits LYMPHATICS: No lymphadenopathy CARDIAC: RRR, no murmurs, no rubs, no gallops RESPIRATORY:  Clear to auscultation without rales, wheezing or rhonchi  ABDOMEN: Soft, non-tender, non-distended MUSCULOSKELETAL:  No edema; No deformity  SKIN: Warm and dry LOWER EXTREMITIES: no swelling NEUROLOGIC:  Alert and oriented x 3 PSYCHIATRIC:  Normal affect   ASSESSMENT:    1. Bradycardia   2. Elevated cholesterol   3. Traumatic subdural hemorrhage with loss of consciousness status unknown, initial encounter (HCC)    PLAN:    In order of problems listed above:  Bradycardia.  Not critical, monitor show average heart rate of 58 minimum 41 maximum 123, first-degree AV block noted.  Will avoid any sinus and AV blocking agent. Elevated cholesterol.  Will schedule.  Fasting lipid profile done.  I did review K PN which show me his total cholesterol 140 HDL 49 this is from October 2023. History of subdural hematoma status postsurgery that happened years ago. Will talk in length about her blood pressure being soft 100/60 encouraged him to drink plenty of fluids also liberate some salt intake. I did review echocardiogram from hospital which showed ejection fraction 5055%, sadly no room for any medication because of blood pressure being low   Medication Adjustments/Labs and Tests  Ordered: Current medicines are reviewed at length with the patient today.  Concerns regarding medicines are outlined above.  No orders of the defined types were placed in this encounter.  Medication changes: No orders of the defined types were placed in this encounter.   Signed, Lamar DOROTHA Fitch, MD, [facility] 11/08/2023 9:50 AM    [facility]

## 2023-11-08 NOTE — Patient Instructions (Addendum)
Medication Instructions:  Your physician recommends that you continue on your current medications as directed. Please refer to the Current Medication list given to you today.  *If you need a refill on your cardiac medications before your next appointment, please call your pharmacy*   Lab Work: Your physician recommends that you return for lab work in: when fasting You need to have labs done when you are fasting.  You can come Monday through Friday 8:30 am to 12:00 pm and 1:15 to 4:30. You do not need to make an appointment as the order has already been placed. The labs you are going to have done are AST, ALT, Lipids.    Testing/Procedures: None Ordered   Follow-Up: At [facility], you and your health needs are our priority.  As part of our continuing mission to provide you with exceptional heart care, we have created designated Provider Care Teams.  These Care Teams include your primary Cardiologist (physician) and Advanced Practice Providers (APPs -  Physician Assistants and Nurse Practitioners) who all work together to provide you with the care you need, when you need it.  We recommend signing up for the patient portal called "MyChart".  Sign up information is provided on this After Visit Summary.  MyChart is used to connect with patients for Virtual Visits (Telemedicine).  Patients are able to view lab/test results, encounter notes, upcoming appointments, etc.  Non-urgent messages can be sent to your provider as well.   To learn more about what you can do with MyChart, go to [email or web address].    Your next appointment:   6 month(s)  The format for your next appointment:   In Person  Provider:   Gypsy Balsam, MD    Other Instructions NA

## 2023-11-15 ENCOUNTER — Other Ambulatory Visit: Payer: Self-pay | Admitting: Neurology

## 2023-11-15 NOTE — Telephone Encounter (Signed)
 Requested Prescriptions   Pending Prescriptions Disp Refills   pregabalin  (LYRICA ) 150 MG capsule [Pharmacy Med Name: Pregabalin  150 MG Oral Capsule] 180 capsule 0    Sig: Take 1 capsule by mouth twice daily   Last 04/26/23 Next appt 04/27/24    Dispenses   Dispensed Days Supply Quantity Provider Pharmacy  PREGABALIN  150MG  CAP 08/14/2023 90 180 each Gregg Lek, MD [facility] [phone] .SABRASABRA

## 2023-12-13 DIAGNOSIS — M1611 Unilateral primary osteoarthritis, right hip: Secondary | ICD-10-CM | POA: Diagnosis not present

## 2023-12-13 DIAGNOSIS — Z23 Encounter for immunization: Secondary | ICD-10-CM | POA: Diagnosis not present

## 2023-12-13 DIAGNOSIS — M1711 Unilateral primary osteoarthritis, right knee: Secondary | ICD-10-CM | POA: Diagnosis not present

## 2023-12-13 DIAGNOSIS — Z6824 Body mass index (BMI) 24.0-24.9, adult: Secondary | ICD-10-CM | POA: Diagnosis not present

## 2023-12-14 DIAGNOSIS — M1611 Unilateral primary osteoarthritis, right hip: Secondary | ICD-10-CM | POA: Diagnosis not present

## 2024-01-04 DIAGNOSIS — Z6824 Body mass index (BMI) 24.0-24.9, adult: Secondary | ICD-10-CM | POA: Diagnosis not present

## 2024-01-04 DIAGNOSIS — M1611 Unilateral primary osteoarthritis, right hip: Secondary | ICD-10-CM | POA: Diagnosis not present

## 2024-01-06 DIAGNOSIS — Z961 Presence of intraocular lens: Secondary | ICD-10-CM | POA: Diagnosis not present

## 2024-02-29 DIAGNOSIS — H6123 Impacted cerumen, bilateral: Secondary | ICD-10-CM | POA: Diagnosis not present

## 2024-03-01 DIAGNOSIS — D485 Neoplasm of uncertain behavior of skin: Secondary | ICD-10-CM | POA: Diagnosis not present

## 2024-03-01 DIAGNOSIS — B359 Dermatophytosis, unspecified: Secondary | ICD-10-CM | POA: Diagnosis not present

## 2024-03-01 DIAGNOSIS — L821 Other seborrheic keratosis: Secondary | ICD-10-CM | POA: Diagnosis not present

## 2024-04-26 NOTE — Progress Notes (Unsigned)
 "  [facility]  PATIENT: Colin Edwards DOB: 06/23/1950  REFERRING CLINICIAN: Conley Dene BROCKS., MD HISTORY FROM: Patient and spouse  REASON FOR VISIT: Dementia and Neuropathy follow up  HISTORICAL  CHIEF COMPLAINT:  Chief Complaint  Patient presents with   Follow-up    Room 2 With wife    Update 04/27/24 SS: MMSE 8/30.  Remains active, does yard work, has been shoveling snow.  He drives with his wife in the car, she does have to help with directions.  He repeats himself.  He remains independent and does his own ADLs.  They are going to [place]  next week to visit his sister who has Alzheimer's.  On Lyrica  twice daily with good benefit for neuropathy.  Would like to increase Exelon , on 1.5 mg twice daily, saw cardiology last year to discuss, was given clearance.  Heart rate today 66 They feel Exelon  has been extremely helpful.  INTERVAL HISTORY 04/26/2023:  Patient presents today for follow-up, he is accompanied by wife.  Last visit was in January.  At that time we updated his ATN profile which was positive for Alzheimer disease biomarker.  He could not tolerate Aricept , therefore we started him on rivastigmine , 1.5 mg twice daily.  He has been tolerating the medication very well.  Overall he is stable per wife.  In terms of his neuropathy, they tell me that the pregabalin  is helpful.  No additional concerns.   INTERVAL HISTORY 04/22/2022:  Patient presents today for follow-up, he is accompanied by wife Colin Edwards.  At last visit we have started him on Aricept  for his memory problem.  He did start it for few days but have GI side effects therefore he self discontinued the medication.  He still on Prevagen, stated that the memory is stable.  He still lives with his wife, still able to drive to familiar places, no recent accident and has not been lost.  He does help around but wife do most of the activities of daily living.  He remains forgetful and also have word finding  difficulties. Today he is complaining of daily headaches, wakes up the headaches. Headaches usually starts in the back of the head and radiates to the front.   INTERVAL HISTORY 04/16/2021 Patient presents today for follow-up with his wife Zebedee, he said that neuropathy is better with the pregabalin  75 mg twice daily.  Still have occasional burning pain.  He is neuropathy labs were done and did not show any abnormalities.  He is also complaining of memory problem.  He think that he forgets very easily, he is easily distracted, he reports he will go outside to pick up something but will forget the reason he went outside in the first place.  Sometimes while driving to familiar places he will get confused but has not reported being lost and having to call his wife or using the GPS to go back home.  Denies any recent accident.  Per wife patient is easily distracted, he does not have issue with recent conversation. A year ago he was noted to be repeating himself numerous times, saying the same story over and over but since starting the Prevagen he has not been doing that anymore.  He still independent, remains active, wife is handling the finances.    HISTORY OF PRESENT ILLNESS:  This is a 74 year old gentleman with past medical history of back pain, hyperlipidemia, GERD who is presenting with complaint of burning sensation in his leg mostly the left leg from the mid  shin to the ankle.  Patient stated in March 2020 he had back surgery due to severe chronic back pain, he had plate and screws placed he mentioned that the back pain did not resolve so 2 years later in March 2022 he had another second surgery done and after the surgery he noted the burning in the legs.  He described the pain as someone sticking a knife in his left leg.  Symptoms are getting worse.  Burning sensation is constant.  Nothing seems to improve the pain.  He is on gabapentin 300 mg 3 times daily but no improvement.  He denies any weakness  of the legs and denies any falls.    OTHER MEDICAL CONDITIONS: Chronic back pain, GERD, HLD  REVIEW OF SYSTEMS: Full 14 system review of systems performed and negative with exception of: as noted in the HPI  ALLERGIES: Allergies  Allergen Reactions   Augmentin [Amoxicillin-Pot Clavulanate]    Ciprofloxacin Nausea And Vomiting    Required IV fluid   Donepezil  Other (See Comments)    Nausea and dizziness   Hydrocodone    Levaquin [Levofloxacin]    Morphine  Nausea And Vomiting   Morphine  And Codeine    Prednisone Other (See Comments)    Intolerant   Sulfa Antibiotics    Tamsulosin    Tramadol     HOME MEDICATIONS: Outpatient Medications Prior to Visit  Medication Sig Dispense Refill   acetaminophen (TYLENOL) 650 MG CR tablet Take 650 mg by mouth every 8 (eight) hours as needed for pain.     cetirizine (ZYRTEC) 10 MG tablet Take 10 mg by mouth daily.     Cholecalciferol (VITAMIN D3) 50 MCG (2000 UT) capsule Take 2,000 Units by mouth daily.     Cyanocobalamin (VITAMIN B-12 PO) Take 2,500 mcg by mouth daily.     dicyclomine (BENTYL) 20 MG tablet Take 20 mg by mouth as needed for spasms.     diphenhydramine-acetaminophen (TYLENOL PM) 25-500 MG TABS tablet Take 1 tablet by mouth at bedtime as needed.     famotidine (PEPCID) 40 MG tablet Take 40 mg by mouth at bedtime.     FIBER PO Take 2 tablets by mouth daily.     finasteride (PROSCAR) 5 MG tablet Take 5 mg by mouth daily.     magnesium oxide (MAG-OX) 400 (240 Mg) MG tablet Take 250 mg by mouth daily.     MELATONIN PO Take 10 mg by mouth at bedtime.     pravastatin (PRAVACHOL) 20 MG tablet Take 20 mg by mouth daily.     pregabalin  (LYRICA ) 150 MG capsule Take 1 capsule by mouth twice daily 180 capsule 1   Probiotic Product (ALIGN PO) Take 1 tablet by mouth daily.     rivastigmine  (EXELON ) 1.5 MG capsule Take 1 capsule (1.5 mg total) by mouth 2 (two) times daily. 180 capsule 3   No facility-administered medications prior to visit.     PAST MEDICAL HISTORY: Past Medical History:  Diagnosis Date   Bradycardia    Chronic back pain    Chronic GERD    Chronic neuropathic pain    Followed by [facility]   Elevated cholesterol    Headache disorder    Irritable bowel    Lumbar degenerative disc disease 06/13/2018   Mild cognitive impairment    Obesity    Peripheral neuropathy    Traumatic subdural hemorrhage with loss of consciousness status unknown, initial encounter (HCC) 07/03/2022   Vitamin D deficiency  PAST SURGICAL HISTORY: Past Surgical History:  Procedure Laterality Date   APPENDECTOMY     CATARACT EXTRACTION Left 12/04/2014   CHOLECYSTECTOMY     INGUINAL HERNIA REPAIR     OCCIPITAL NERVE STIMULATOR INSERTION  2021   Dr[facility]   SPINE SURGERY  2020   Dr[facility]    FAMILY HISTORY: Family History  Problem Relation Age of Onset   Coronary artery disease Mother    COPD Mother    Alzheimer's disease Father    Coronary artery disease Father    COPD Father    Coronary artery disease Sister    Coronary artery disease Brother     SOCIAL HISTORY: Social History   Socioeconomic History   Marital status: Married    Spouse name: Not on file   Number of children: Not on file   Years of education: Not on file   Highest education level: Not on file  Occupational History   Not on file  Tobacco Use   Smoking status: Never   Smokeless tobacco: Never  Substance and Sexual Activity   Alcohol use: Yes    Comment: rare   Drug use: Not on file   Sexual activity: Not on file  Other Topics Concern   Not on file  Social History Narrative   Retired    Social Drivers of Health   Tobacco Use: Low Risk (11/08/2023)   Patient History    Smoking Tobacco Use: Never    Smokeless Tobacco Use: Never    Passive Exposure: Not on file  Financial Resource Strain: Not on file  Food Insecurity: Not on file  Transportation Needs: Not on file  Physical Activity: Not on file  Stress:  Not on file  Social Connections: Not on file  Intimate Partner Violence: Not on file  Depression (EYV7-0): Not on file  Alcohol Screen: Not on file  Housing: Not on file  Utilities: Not on file  Health Literacy: Not on file    PHYSICAL EXAM  GENERAL EXAM/CONSTITUTIONAL: Vitals:  Vitals:   04/27/24 1105  BP: 130/72  Pulse: 66  SpO2: 98%  Weight: 159 lb (72.1 kg)  Height: 5' 8 (1.727 m)     Body mass index is 24.18 kg/m. Wt Readings from Last 3 Encounters:  04/27/24 159 lb (72.1 kg)  11/08/23 158 lb (71.7 kg)  08/18/23 161 lb (73 kg)    NEUROLOGIC: MENTAL STATUS:     04/27/2024   11:10 AM 04/26/2023   11:42 AM 04/22/2022   11:17 AM  MMSE - Mini Mental State Exam  Orientation to time 0 2 1  Orientation to Place 1 4 4   Registration 0 3 3  Attention/ Calculation 0 0 0  Recall 0 0 0  Language- name 2 objects 2 2 2   Language- repeat 1 0 1  Language- follow 3 step command 3 3 3   Language- read & follow direction 1 1 0  Write a sentence 0 0 0  Copy design 0 0 0  Total score 8 15 14    Physical Exam  General: The patient is alert and cooperative at the time of the examination.  Skin: No significant peripheral edema is noted.  Neurologic Exam  Mental status: The patient is alert and oriented x 3 at the time of the examination. Repeats himself a few times, pleasant and interactive.   Cranial nerves: Facial symmetry is present. Speech is normal, no aphasia or dysarthria is noted. Extraocular movements are full. Visual fields  are full.  Motor: The patient has good strength in all 4 extremities.  Sensory examination: Soft touch sensation is symmetric on the face, arms, and legs.  Coordination: The patient has good finger-nose-finger and heel-to-shin bilaterally.  Gait and station: The patient has a normal gait.   DIAGNOSTIC DATA (LABS, IMAGING, TESTING) - I reviewed patient records, labs, notes, testing and imaging myself where available.  Lab Results   Component Value Date   WBC 5.7 01/15/2021   HGB 14.3 01/15/2021   HCT 42.9 01/15/2021   MCV 94 01/15/2021   PLT 166 01/15/2021      Component Value Date/Time   NA 143 01/15/2021 1349   K 4.4 01/15/2021 1349   CL 103 01/15/2021 1349   CO2 27 01/15/2021 1349   GLUCOSE 79 01/15/2021 1349   BUN 17 01/15/2021 1349   CREATININE 0.99 01/15/2021 1349   CALCIUM 9.2 01/15/2021 1349   PROT 6.6 01/15/2021 1349   ALBUMIN 4.4 01/15/2021 1349   AST 19 01/15/2021 1349   ALT 16 01/15/2021 1349   ALKPHOS 78 01/15/2021 1349   BILITOT 0.3 01/15/2021 1349   No results found for: CHOL, HDL, LDLCALC, LDLDIRECT, TRIG, CHOLHDL Lab Results  Component Value Date   HGBA1C 5.6 01/15/2021   Lab Results  Component Value Date   VITAMINB12 1,689 (H) 01/15/2021   Lab Results  Component Value Date   TSH 2.650 01/15/2021   ATN Profile consistent with Alzheimer disease pathology.   MRI Brain 05/11/2022 No evidence of acute intracranial abnormality  Mild chronic small vessel ischemic changes within the cerebral white matter.  ASSESSMENT AND PLAN  1.  Alzheimer's disease  Declining MMSE 8/30, but he is more active and functional than his memory testing would suggest.  He saw cardiology last year to gain clearance to increase Exelon .  Currently taking 1.5 mg twice a day.  He and his wife have noted significant benefit since starting Exelon .  They wish to try to increase.  I have cautioned about the risk of bradycardia.  Today his heart rate is 66.  Given that they have discussed with cardiology, we will do a trial of Exelon  3 mg twice daily.  Advised his wife to increase to 1 dose to start before increasing both, monitor HR. She is a retired engineer, civil (consulting).  I also gave the option of adding on Namenda.  They prefer to stay with Exelon .  Caution with driving.  Remain active, exercise.  We will follow-up in 6 months.  He will remain on Lyrica  150 mg twice daily for neuropathy.  Lauraine Gayland MANDES, DNP   [facility] [address] [place], [place] [address] [phone]  "

## 2024-04-27 ENCOUNTER — Ambulatory Visit: Payer: Medicare HMO | Admitting: Neurology

## 2024-04-27 VITALS — BP 130/72 | HR 66 | Ht 68.0 in | Wt 159.0 lb

## 2024-04-27 DIAGNOSIS — F0280 Dementia in other diseases classified elsewhere without behavioral disturbance: Secondary | ICD-10-CM | POA: Insufficient documentation

## 2024-04-27 DIAGNOSIS — G629 Polyneuropathy, unspecified: Secondary | ICD-10-CM

## 2024-04-27 DIAGNOSIS — F02B0 Dementia in other diseases classified elsewhere, moderate, without behavioral disturbance, psychotic disturbance, mood disturbance, and anxiety: Secondary | ICD-10-CM

## 2024-04-27 MED ORDER — RIVASTIGMINE TARTRATE 3 MG PO CAPS: 3.0000 mg | ORAL_CAPSULE | Freq: Two times a day (BID) | ORAL | 5 refills | Status: AC

## 2024-04-27 NOTE — Patient Instructions (Addendum)
 Increase Exelon , start by taking 1.5 mg AM/ 3 mg PM for a few weeks, if heart rate remains > 60, may increase to 3 mg twice daily. Will need to monitor the HR, as Exelon  can lower the heart rate. Stay active, exercise.   Meds ordered this encounter  Medications   rivastigmine  (EXELON ) 3 MG capsule    Sig: Take 1 capsule (3 mg total) by mouth 2 (two) times daily.    Dispense:  60 capsule    Refill:  5

## 2024-11-13 ENCOUNTER — Ambulatory Visit: Admitting: Neurology
